# Patient Record
Sex: Female | Born: 1990 | Race: White | Hispanic: No | Marital: Married | Smoking: Never smoker
Health system: Southern US, Community
[De-identification: ages and names within clinical notes are randomized; demographics above are authoritative.]

## PROBLEM LIST (undated history)

## (undated) DIAGNOSIS — R102 Pelvic and perineal pain: Secondary | ICD-10-CM

## (undated) DIAGNOSIS — N809 Endometriosis, unspecified: Secondary | ICD-10-CM

## (undated) DIAGNOSIS — F419 Anxiety disorder, unspecified: Secondary | ICD-10-CM

## (undated) HISTORY — PX: APPENDECTOMY: SHX54

## (undated) HISTORY — DX: Anxiety disorder, unspecified: F41.9

## (undated) HISTORY — PX: DIAGNOSTIC LAPAROSCOPY: SUR761

---
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
## 2010-04-25 ENCOUNTER — Encounter: Payer: Self-pay | Admitting: Internal Medicine

## 2010-06-07 ENCOUNTER — Encounter: Admission: RE | Admit: 2010-06-07 | Discharge: 2010-06-07 | Payer: Self-pay | Admitting: Family Medicine

## 2010-06-11 ENCOUNTER — Encounter: Admission: RE | Admit: 2010-06-11 | Discharge: 2010-06-11 | Payer: Self-pay | Admitting: Family Medicine

## 2010-06-27 ENCOUNTER — Encounter: Admission: RE | Admit: 2010-06-27 | Discharge: 2010-06-27 | Payer: Self-pay | Admitting: Family Medicine

## 2010-08-22 DIAGNOSIS — L708 Other acne: Secondary | ICD-10-CM | POA: Insufficient documentation

## 2010-08-22 DIAGNOSIS — R21 Rash and other nonspecific skin eruption: Secondary | ICD-10-CM

## 2010-09-10 ENCOUNTER — Encounter (INDEPENDENT_AMBULATORY_CARE_PROVIDER_SITE_OTHER): Payer: Self-pay | Admitting: *Deleted

## 2010-09-10 DIAGNOSIS — K589 Irritable bowel syndrome without diarrhea: Secondary | ICD-10-CM | POA: Insufficient documentation

## 2010-09-10 DIAGNOSIS — R1084 Generalized abdominal pain: Secondary | ICD-10-CM

## 2010-09-10 DIAGNOSIS — G43909 Migraine, unspecified, not intractable, without status migrainosus: Secondary | ICD-10-CM | POA: Insufficient documentation

## 2010-09-10 DIAGNOSIS — M4120 Other idiopathic scoliosis, site unspecified: Secondary | ICD-10-CM | POA: Insufficient documentation

## 2010-09-10 DIAGNOSIS — N809 Endometriosis, unspecified: Secondary | ICD-10-CM | POA: Insufficient documentation

## 2010-09-10 DIAGNOSIS — I498 Other specified cardiac arrhythmias: Secondary | ICD-10-CM | POA: Insufficient documentation

## 2010-09-20 ENCOUNTER — Ambulatory Visit: Payer: Self-pay | Admitting: Internal Medicine

## 2010-09-20 DIAGNOSIS — Z8719 Personal history of other diseases of the digestive system: Secondary | ICD-10-CM

## 2010-09-20 DIAGNOSIS — N3010 Interstitial cystitis (chronic) without hematuria: Secondary | ICD-10-CM | POA: Insufficient documentation

## 2010-09-20 DIAGNOSIS — A039 Shigellosis, unspecified: Secondary | ICD-10-CM | POA: Insufficient documentation

## 2010-09-20 DIAGNOSIS — Z9089 Acquired absence of other organs: Secondary | ICD-10-CM

## 2011-01-24 NOTE — Miscellaneous (Signed)
Summary: Problems, Medications and Alleriges updated  Clinical Lists Changes  Problems: Added new problem of RASH AND OTHER NONSPECIFIC SKIN ERUPTION (ICD-782.1) Added new problem of ENDOMETRIOSIS, SITE UNSPECIFIED (ICD-617.9) Added new problem of IBS (ICD-564.1) Added new problem of DIARRHEA (ICD-787.91) Added new problem of MIGRAINE UNSP W/O INTRACT W/O STATUS MIGRAINOSUS (ICD-346.90) Added new problem of OTHER SPECIFIED CARDIAC DYSRHYTHMIAS (ICD-427.89) Added new problem of SCOLIOSIS , IDIOPATHIC (ICD-737.30) Added new problem of ABDOMINAL PAIN, GENERALIZED (ICD-789.07) Medications: Added new medication of DESONIDE 0.05 % CREA (DESONIDE) as directed by PCP Added new medication of IMITREX 25 MG TABS (SUMATRIPTAN SUCCINATE) as directed by PCP Added new medication of BIOTIN 10 MG TABS (BIOTIN) as directed by PCP Added new medication of OCELLA 3-0.03 MG TABS (DROSPIRENONE-ETHINYL ESTRADIOL) as directed by PCP Added new medication of MULTIVITAMINS  TABS (MULTIPLE VITAMIN) as directed by PCP Added new medication of ALIGN  CAPS (PROBIOTIC PRODUCT) as directed by PCP Allergies: Added new allergy or adverse reaction of VIBRAMYCIN (DOXYCYCLINE CALCIUM) Added new allergy or adverse reaction of AMOXICILLIN (AMOXICILLIN) Observations: Added new observation of NKA: F (09/10/2010 12:02)

## 2011-01-24 NOTE — Consult Note (Signed)
Summary: [facility]   Imported By: Florinda Marker 10/10/2010 09:34:27  _____________________________________________________________________  External Attachment:    Type:   Image     Comment:   External Document

## 2011-01-24 NOTE — Consult Note (Signed)
Summary: New Pt. Referral: [facility]  New Pt. Referral: [facility]   Imported By: Florinda Marker 10/08/2010 16:37:54  _____________________________________________________________________  External Attachment:    Type:   Image     Comment:   External Document

## 2011-01-24 NOTE — Assessment & Plan Note (Signed)
Summary: new pt facial rash/skin eruption   CC:  rash that has 8-9 times since returning from [place].  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 who was referred to me by Dr. Elias Else for evaluation of rash. Her mother, Waynetta Sandy, is with her today. Her current problem began in July of 2010. She traveled to [place] in early July and traveled along [place].  She felt well while there  but shortly after her return she developed a new rash on her right anterior thigh.  It initially started as the lining of small red bumps about 4 inches long.  After a few days the lesions started to blister and loose fluid within the area became swollen and painful before finally scabbing over and slowly healing.  It took about two weeks for the rash to completely resolve.  It did not itch.  She did not have any fever. Since that initial episode, she estimates that she's had 7 other recurrences of the rash, all in different locations.  Each time it would follow the same general pattern.  The last episode seemed to be a little worse and with that one she had some rash on her face and on her left wrist.  She does not have any active lesions at this time.  She has tried topical steroids without any relief and she has gotten no apparent benefit from oral prednisone or antibiotics including Keflex.  She saw an Geophysicist/field seismologist and Dr. Thayer Ohm office and had a skin biopsy in June.  All she recalls is that she was told the biopsy did not show any herpes or cancer.  She also is concerned about some new sharp abdominal pain that she's had for the last two weeks.  She says that it's a different from the pain she has with her chronic intermittent diarrhea or her interstitial cystitis.  She's had a few episodes of nausea and vomiting with the pain but again no fever.  Preventive Screening-Counseling & Management  Alcohol-Tobacco     Alcohol drinks/day: 0     Smoking Status: never  Caffeine-Diet-Exercise   Caffeine use/day: yes   Prior Medication List:  DESONIDE 0.05 % CREA (DESONIDE) as directed by PCP IMITREX 25 MG TABS (SUMATRIPTAN SUCCINATE) as directed by PCP BIOTIN 10 MG TABS (BIOTIN) as directed by PCP OCELLA 3-0.03 MG TABS (DROSPIRENONE-ETHINYL ESTRADIOL) as directed by PCP MULTIVITAMINS  TABS (MULTIPLE VITAMIN) as directed by PCP ALIGN  CAPS (PROBIOTIC PRODUCT) as directed by PCP   Current Allergies: ! VIBRAMYCIN (DOXYCYCLINE CALCIUM) ! AMOXICILLIN (AMOXICILLIN) Vital Signs:  Patient profile:   20 year old female Height:      65 inches (165.10 cm) Weight:      131.5 pounds (59.77 kg) BMI:     21.96 Temp:     98.6 degrees F oral Pulse rate:   94 / minute BP sitting:   102 / 69  Vitals Entered By: Jennet Maduro RN (September 20, 2010 2:27 PM) CC: rash that has 8-9 times since returning from [place] Is Patient Diabetic? No Pain Assessment Patient in pain? yes     Location: UPPER ABDOMINA Intensity: 4 Type: sharp Onset of pain  Constant Nutritional Status BMI of 19 -24 = normal Nutritional Status Detail appetite "recovering from c.diff, eating better"  Have you ever been in a relationship where you felt threatened, hurt or afraid?not asked mother present   Does patient need assistance? Functional Status Self care Ambulation Normal   Physical  Exam  General:  alert and well-nourished.   Mouth:  good dentition, pharynx pink and moist, no erythema, and no exudates.   Lungs:  normal breath sounds, no crackles, and no wheezes.   Heart:  normal rate, regular rhythm, and no murmur.   Abdomen:  soft, non-tender, normal bowel sounds, no distention, no masses, no hepatomegaly, and no splenomegaly.   Skin:  no rashes and no suspicious lesions.   Cervical Nodes:  no anterior cervical adenopathy and no posterior cervical adenopathy.   Axillary Nodes:  no R axillary adenopathy and no L axillary adenopathy.   Psych:  normally interactive, good eye contact, not anxious  appearing, and not depressed appearing.     Impression & Recommendations:  Problem # 1:  RASH AND OTHER NONSPECIFIC SKIN ERUPTION (ICD-782.1) I am not sure what is causing her migratory, recurrent skin lesions.  There is a temporal relationship with her travel to [place] but I am not certain that these are due to some travel-related infection.  I have shown her pictures of cutaneous larva migrans and larva currens but she and her mother do not think a rash looks like either of these.  I will obtain the formal report from her skin biopsy and have asked her to call me if she has recurrent lesions so I can see them or at least have her send pictures.  Hopefully that will help pinpoint a treatable diagnosis. Her updated medication list for this problem includes:    Desonide 0.05 % Crea (Desonide) .Marland Kitchen... As directed by pcp  Orders: Consultation Level IV (78469)  Problem # 2:  ABDOMINAL PAIN, GENERALIZED (ICD-789.07) I'm not sure what's causing her recent abdominal pain.  She does not have anything to suggest an acute abdomen.  She thinks the pain is different than the pains she has with her IBS and interstitial cystitis.  I have suggested that she simply to manage it symptomatically and see if it resolves with time. Orders: Consultation Level IV ([id number])  Patient Instructions: 1)  Please schedule a follow-up appointment as needed.

## 2011-01-24 NOTE — Miscellaneous (Signed)
Summary: HIPAA Restrictions  HIPAA Restrictions   Imported By: Florinda Marker 09/20/2010 15:55:38  _____________________________________________________________________  External Attachment:    Type:   Image     Comment:   External Document

## 2011-03-10 LAB — POCT PREGNANCY, URINE: Preg Test, Ur: NEGATIVE

## 2011-05-07 NOTE — Op Note (Signed)
NAME:  Linda Stevenson, Linda Stevenson         ACCOUNT NO.:  [id number]   MEDICAL RECORD NO.:  [id number]          PATIENT TYPE:  AMB   LOCATION:  ENDO                         FACILITY:  [facility]   PHYSICIAN:  James L. Malon Kindle., M.D.DATE OF BIRTH:  May 09, 1991   DATE OF PROCEDURE:  05/03/2009  DATE OF DISCHARGE:                               OPERATIVE REPORT   PROCEDURE:  Esophagogastroduodenoscopy with biopsy.   MEDICATIONS:  1. Hurricaine spray.  2. Fentanyl 100 mcg.  3. Versed 10 mg IV.   INDICATION:  The patient has had abdominal pain, weight loss, and  diarrhea of unclear etiology.  Workup has been negative for stool  cultures and CT enterography.  She has failed therapy with  antispasmodics and Imodium.  She has been evaluated for celiac disease  and Crohn's colitis.   DESCRIPTION OF PROCEDURE:  The procedure been explained to the patient's  mother and consent obtained.  In the left lateral decubitus position,  the Pentax adult scope was inserted and advanced.  The stomach was  entered, the pylorus identified and passed.  We were able to advance way  down into the second duodenum, which appeared grossly normal.  Multiple  random biopsies were obtained to evaluate for celiac disease.  The  duodenal bulb was normal as was the pyloric channel.  The antrum and  body of the stomach were endoscopically normal on the forward and in the  retroflexed view.  The distal esophagus was somewhat reddened.  The  scope was withdrawn, the patient tolerated the procedure well, there  were no immediate complications.   ASSESSMENT:  Diarrhea, weight loss, and abdominal pain without obvious  cause on upper endoscopy.  Will evaluate for celiac disease.           ______________________________  Llana Aliment. Malon Kindle., M.D.     Waldron Session  D:  05/03/2009  T:  05/03/2009  Job:  [id number]   cc:   Dario Guardian, M.D.  Fax: [phone]   Charles A. Sydnee Cabal, MD  Fax: [phone]

## 2011-05-07 NOTE — Op Note (Signed)
NAME:  Linda Stevenson, Linda Stevenson         ACCOUNT NO.:  [id number]   MEDICAL RECORD NO.:  [id number]          PATIENT TYPE:  AMB   LOCATION:  SDC                           FACILITY:  [facility]   PHYSICIAN:  Charles A. Delcambre, MDDATE OF BIRTH:  1991-01-10   DATE OF PROCEDURE:  12/19/2008  DATE OF DISCHARGE:                               OPERATIVE REPORT   PREOPERATIVE DIAGNOSES:  1. Pelvic pain.  2. Dysmenorrhea.   POSTOPERATIVE DIAGNOSES:  1. Pelvic pain.  2. Dysmenorrhea.  3. 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  4. Endometriosis, mild, active disease.   PROCEDURE:  1. Diagnostic laparoscopy.  2. Laser endometriosis.  3. Major lysis of adhesions greater than 1 hour modifier 22.  4. Laser uterosacral nerve ablation.   SURGEON:  Charles A. Delcambre, MD   ASSISTANT:  None.   COMPLICATIONS:  None.   ANESTHESIA:  General by the endotracheal route.   FINDINGS:  Masters window on the right ovarian fossa.  No active disease  noted in that area.  Some endometriosis on the right uterosacral noted.  Moderate adhesions of the omentum to the bladder.  Bladder adhesions to  the left pelvic sidewall on tension with previous appendectomy scar  noted.  The cecum was tented upward and adherent and on tension to this  area with omentum adherent in this area as well.  Omentum was adherent  in the hypogastric region directly up to the anterior abdominal wall.  Bowel in the upper portion of the juncture of the ascending and  transverse colon was tented upward and somewhat pulled down toward the  appendix site and this again was on tension.  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  Moderate adhesions were noted consistent with previous ruptured appendix  of the fallopian tubes, proximal region to the uterus and down in the  cul-de-sac.  However, distal ends of fimbria looked normal bilaterally.  Ovaries were normal  bilaterally after lysis of adhesions to mobilize the  ovaries was done.  Ureters were clearly seen during the entire case on  the left and right side.  Dissection for extensive lysis of adhesions  took over an hour in the case.   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   ESTIMATED BLOOD LOSS:  Less than 10 mL.   Instrument, sponge, and needle count correct x2.   DESCRIPTION OF PROCEDURE:  The patient was taken to the operating room,  placed in supine position.  General anesthetic was induced without  difficulty.  Sterile prep and drape was undertaken.  Cohen cannula was  placed with tenaculum on the cervix.  A 0.25% plain Marcaine was  injected subumbilically and 2 fingerbreadths below the symphysis pubis.  Later in the case a second lower port approximately 4 cm lateral to the  first was placed.  A 1-cm incision was made at the umbilicus to place  the operating scope.  The Veress needle was placed with anterior  traction on the abdominal wall.  Aspiration injection and reaspiration,  hanging drop test and insufflation pressures less 8 mmHg at 2 liters  per  minute all indicated intraperitoneal location.  Adequate  pneumoperitoneum was achieved to 2.5 liters.  The Veress needle was  removed.  Anterior traction was again placed and 10-mm port was placed  at the umbilicus.  Verification into intraperitoneal location and  verification of no damage to surrounding structures was made by  placement of the scope.  Findings were noted above in some areas of the  omental adhesions and the bowel adhesions staying well away from the  bowel and up against the sidewalls.  The EnSeal was used to lyse  adhesions.  Some points around the uterus adhesions were lysed with the  unipolar cautery.  Bilateral cautery was used once for hemostasis.  It  was felt that the adhesions of the bowel up to the sidewall did lyse  well safely and did relax all intestinal structures back into normal  anatomical  position.  At the termination of procedure, the uterus and  bladder, the fallopian tubes and ovaries were all mobilized and ureters  were clearly seen.  Laser uterosacral nerve ablation was done by  ablating the proximal uterosacral ligaments approximately two-thirds of  the way through for about 2 cm across the midline and then down the  other side in like fashion approximately 1 cm.  Hemostasis was  excellent.  Low-pressure hemostasis was verified upon removal of all  trocar sites.  Desufflation was allowed to occur.  Peritoneal edge was  seen upon withdrawal of the umbilical port and 0 Vicryl was then placed  to close the fascia at the umbilicus.  Hemostasis was excellent at all  sites.  The skin was closed with Dermabond at all three sites.  The  vaginal instruments were removed.  Hemostasis was excellent.  The  patient was awakened, taken to recovery with physician in attendance,  having tolerated the procedure well.      Charles A. Sydnee Cabal, MD  Electronically Signed     CAD/MEDQ  D:  12/19/2008  T:  12/19/2008  Job:  [id number]

## 2011-05-07 NOTE — Op Note (Signed)
NAME:  Linda Stevenson, CAMERA         ACCOUNT NO.:  [id number]   MEDICAL RECORD NO.:  [id number]          PATIENT TYPE:  AMB   LOCATION:  ENDO                         FACILITY:  [facility]   PHYSICIAN:  James L. Malon Kindle., M.D.DATE OF BIRTH:  [birth year]   DATE OF PROCEDURE:  05/03/2009  DATE OF DISCHARGE:                               OPERATIVE REPORT   PROCEDURE:  Colonoscopy with biopsy.   MEDICATIONS:  Fentanyl 125 mcg, Versed 12 mg, Phenergan 12.5 mg IV for  both procedures.   INDICATIONS:  Diarrhea with weight loss and abdominal pain of unclear  etiology.   DESCRIPTION OF PROCEDURE:  The procedure has been explained to the  patient and consent obtained.  In the left lateral decubitus position  following the endoscopy, the Pentax pediatric colonoscope was inserted  and advanced.  The prep was excellent.  Using abdominal pressure, we  were able to reach the cecum.  The ileocecal valve and appendiceal  orifice seen.  The terminal ileum entered for approximately 10 cm.  It  was grossly endoscopically normal.  Several biopsies were obtained.  The  scope was withdrawn, and the colonic mucosa throughout was completely  normal.  Multiple random biopsies were obtained and sent in a third jar  to look for colitis.  The rectum was normal in both the forward and  retroflexed view.  The patient tolerated the procedure well.   ASSESSMENT:  Diarrhea, weight loss, and abdominal pain, unclear  etiology.  Rule out microscopic colitis or inflammatory bowel disease.   PLAN:  Will see the patient back in the office in 2 weeks.  Continue on  her current medications for now.           ______________________________  Llana Aliment. Malon Kindle., M.D.     Waldron Session  D:  05/03/2009  T:  05/03/2009  Job:  [id number]   cc:   Dario Guardian, M.D.  Fax: [phone]   Charles A. Sydnee Cabal, MD  Fax: [phone]

## 2011-05-10 NOTE — Op Note (Signed)
NAME:  Linda Stevenson, Linda Stevenson                   ACCOUNT NO.:  [id number]   MEDICAL RECORD NO.:  [id number]                   PATIENT TYPE:  OIB   LOCATION:  2550                                 FACILITY:  [facility]   PHYSICIAN:  Leonia Corona, M.D.               DATE OF BIRTH:  [birth year]   DATE OF PROCEDURE:  07/26/2003  DATE OF DISCHARGE:                                 OPERATIVE REPORT   PREOPERATIVE DIAGNOSES:  1. Healed perforated appendicitis.  2. Multiple benign skin lesions on the right side of the neck.   POSTOPERATIVE DIAGNOSES:  1. Healed perforated appendicitis.  2. Multiple benign skin  lesions on the right side of the neck.   PROCEDURES PERFORMED:  1. Open appendectomy.  2. Wide excision of benign lesions on the neck x3.   ANESTHESIA:  General endotracheal tube anesthesia.   SURGEON:  Leonia Corona, M.D.   ASSISTANTDonnella Bi D. Pendse, M.D.   INDICATIONS FOR PROCEDURE:  This 20 year old female child was admitted with  acute perforated appendicitis with abscess formation about 3 months ago  which was treated nonoperatively by percutaneous drainage and antibiotic  therapy. She responded well and was discharged in good condition with 3  weeks course of antibiotics. This time she was admitted for elective  appendectomy.   She also has 3 lesions, each measuring about 3 to 4 mm in size on the right  side of the neck. These have remained unchanged over a period of time as  indications for the procedure.   DESCRIPTION OF PROCEDURE:  The patient was brought to the operating room and  placed in the supine position on the operating table. General endotracheal  tube anesthesia is given. The right lower quadrant of the abdomen and the  area of the abdominal wall is preprepped and draped in the usual sterile  manner.   A right lower quadrant McBurney incision centered at the McBurney point is  made, measuring about 4 cm, extending equally on either side of the  McBurney  point along the skin crease. The incision is deepened through the  subcutaneous tissue with electrocautery until the external aponeurosis is  reached. The external oblique is incised in the line of  its fibers. The  internal oblique and transverse abdominus muscles are split in the line of  their fibers. The peritoneum is visualized and it is held up between 2  clamps and divided between 2 clamps.   The cecum was found to be just beneath the incision which was held up with  fingers and traced leading to the small, fibrotic, curled up appendix which  was curled within position. It was hooked up with a finger and delivered  partly out of the wound. The cecum was partly mobilized and also delivered  partly out of the incision and the appendix was held up with a Babcock  forceps.   The mesoappendix was divided between clamps and ligated with 3-0 silk  until  the base of the appendix was clear. The base of the appendix was crushed  with a straight clamp and ligated with 2-0 Vicryl. The appendix was divided  above and removed from the field. The stump was buried under a pursestring  taken with 3-0 silk on the wall of the cecum.   The cecum with the ligated buried appendix was returned back into the  peritoneal cavity and local irrigation with saline was done. No bleeding was  noted. The abdomen is now closed.   The peritoneum was now closed using 2-0 Vicryl running stitches. The  internal oblique and transverse abdominus muscles were approximated with 2-0  Vicryl interrupted sutures. The external oblique was approximated using 2-0  Vicryl interrupted sutures. Approximately 10 mL of 0.25% Marcaine with  epinephrine was infiltrated in and around the incision for postoperative  pain control. The skin was closed with 5-0 Monocryl subcuticular stitch.  Steri-Strips were applied which were covered with sterile gauze and Tegaderm  dressing.   After completing this part of  the procedure  attention was turned toward the  neck. The neck was turned to the left side. The right side of the neck was  cleaned, prepped and draped in the usual manner.   An elliptical incision was made, removing the 1st anterior lesion and  the  lesion  was  excised with full thickness skin, leaving a clear margin on all sides. The  edges of  the skin  were mobilized and the defect was closed primarily using  6-0 Prolene interrupted sutures.   The 2nd and 3rd lesions were too close to each other  and they were both  enclosed in 1 elliptical incision around it leaving a clear margin of the  lesion. The lesion was lifted off in the subcutaneous plane with the help of  scissors and excised completely. The edges of the skin  were mobilized.  Oozing and bleeding spots were cauterized. The defect was closed primarily  using 6-0 Prolene interrupted sutures.   Approximately 3 mL of 0.25% Marcaine with epinephrine was infiltrated in and  around the incision. Steri-Strips were applied over the incision. A gauze  dressing  was applied.   The patient tolerated the procedure very well which went smooth and  uneventful. The patient was later extubated and transported to the recovery  room in good and stable condition.                                                Leonia Corona, M.D.    SF/MEDQ  D:  07/26/2003  T:  07/26/2003  Job:  [id number]   cc:   Dario Guardian, M.D.  [address]  [place]  [place] [address]  Fax: [phone]

## 2011-05-10 NOTE — Discharge Summary (Signed)
NAME:  Linda Stevenson, Linda Stevenson                   ACCOUNT NO.:  [id number]   MEDICAL RECORD NO.:  [id number]                   PATIENT TYPE:  INP   LOCATION:  6121                                 FACILITY:  [facility]   PHYSICIAN:  Leonia Corona, M.D.               DATE OF BIRTH:  1991/07/25   DATE OF ADMISSION:  04/14/2003  DATE OF DISCHARGE:  04/20/2003                                 DISCHARGE SUMMARY   DISCHARGE DIAGNOSES:  1. Ruptured acute appendicitis with walled-off abscess.  2. Status post CT-guided drainage of the abscess on April 15, 2003.  3. Peripherally inserted central catheter line placement on April 15, 2003.   DISCHARGE MEDICATIONS:  1. Percocet 5/325 mg one tablet p.o. q.4h. p.r.n. pain.  2. Rocephin 1 g IV q.24h. x10 days, then p.o. antibiotics per Dr. Sanjuan Dame     [place]/Dr. Donnella Bi D. Pendse.   FURTHER DISCHARGE INSTRUCTIONS:   DIET:  The patient is to advance her diet as tolerated.   WOUND CARE:  Wound care is to be dressing changes per Dr. Levie Heritage.  PICC line  is to be performed by the home health agency.   FOLLOWUP:  Followup is with Dr. Levie Heritage by phone on April 21, 2003.  The  patient was also instructed to call for a temperature greater than 101,  vomiting, increased pain or any other questions or concerns.   ADMISSION HISTORY:  The patient is an 20 year old female who presented with  an eight-day history of fever, vomiting, abdominal pain and diarrhea.  Her  pain was diffuse and moved to the right lower quadrant.  The patient had  complained of anorexia and pain worsening with movement.  She saw Dr. Oletta Darter. Reid the morning of admission, who ordered a CT of the abdomen which  showed an abscess and ruptured appendicitis.   PAST MEDICAL HISTORY:  She was diagnosed with asthma but has no emergency  room visits, no hospitalizations.  No previous hospitalizations or  surgeries.   MEDICINES:  Albuterol MDI one to two times per year.   ALLERGIES:  No  known drug allergies.   HEALTH MAINTENANCE:  Immunizations are up to date.   SOCIAL HISTORY:  She lives with her mother, father and a 74 year old  sisters.  She has four dogs and city water.  No tobacco, drugs, alcohol,  ________.  No exposure to TB.   FAMILY HISTORY:  Maternal grandfather has lung cancer, maternal grandmother  with breast cancer, paternal grandfather with skin cancer and there is  hypertension in multiple family members.   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   REVIEW OF SYSTEMS:  Review of systems significant for rash around the nose  and scoliosis, otherwise, negative without any  eye/ear/heart/GI/lung/hematologic problems.   PHYSICAL EXAMINATION:  VITAL SIGNS:  Temperature 99.1, pulse 131, blood  pressure 118/92, respirations 22  and SAO2 was 99% on room air.  Weight is  46.8 kg (76th percentile).  GENERAL:  She is a pleasant, acutely ill white female in no acute distress  except for wincing with exam and walking.  HEENT:  Within normal limits.  BREASTS:  Tanner 2 to 3.  CARDIOVASCULAR:  Regular rate and rhythm, slightly tachycardic without  murmurs.  LUNGS:  Lungs are clear to auscultation bilaterally.  ABDOMEN:  Abdomen with hypoactive bowel sounds, tender in the right lower  quadrant with some guarding.  No hepatosplenomegaly.  Positive psoas sign.  GENITALIA:  Tanner 3 with normal external female genitalia.  EXTREMITIES:  No clubbing, cyanosis or edema and has good capillary refill.   LABORATORY DATA:  At presentation, labs were done by the primary and were  not available but eventually revealed a white count of 15.0 with an ANC of  12.8.  Electrolytes were all within normal.   HOSPITAL COURSE:  This 20 year old was admitted for evaluation and treatment  of a ruptured appendicitis with subsequent walled-off appendiceal abscess.  She underwent a CT-guided drainage of the  abscess as well as PICC line  placement on April 15, 2003.  She was treated with IV antibiotics --  ampicillin, gentamicin and clindamycin -- for five days and then  transitioned over to Rocephin daily.  She received IV fluids and IV pain  medication and was then titrated to oral intake as well as oral pain  medicines.  The hospital course was otherwise uncomplicated.   Blood cultures was obtained on approximately hospital day #4 for spiking  fevers, but at the time of discharge, had no growth to date and note, the  patient was on antibiotics at the time of it being drawn.  Wound culture  from the abscess drainage revealed a moderate E. coli that was pansensitive  to all medicines tested, including Rocephin.  It was only insensitive to  ampicillin.   She is to be followed as an outpatient and to continue with a 10-day course  of IV antibiotics and then p.o. antibiotics and then to return in  approximately three weeks for an appendectomy to be performed by Dr.  Leeanne Mannan or Dr. Levie Heritage.   At the time of discharge, the patient was feeling much better.  She had  minimal pain but did have good control of this with p.o. Percocet.  She was  not vomiting and tolerating a full-liquid diet.  She had been afebrile for  greater than 48 hours.  She is to continue with the medicines and to follow  up as previously described.     Douglass Rivers, M.D.                      Leonia Corona, M.D.    CH/MEDQ  D:  04/20/2003  T:  04/20/2003  Job:  [id number]   cc:   Dario Guardian, M.D.  [address]  [place]  [place] [address]  Fax: [phone]

## 2011-07-31 IMAGING — US US SOFT TISSUE HEAD/NECK
1 series · 1 of 1 positions shown · non-contrast
Comparison: None.

CLINICAL DATA: 18-year-0-month-old female who reports a the right
neck palpable abnormality near collarbone which she first noticed
after starting Depo-Provera.  She reports the area is slowly
increasing in size and is sometimes painful (with neck motion).
Past medical history otherwise positive only for appendicitis,
endometriosis, and colitis.

ULTRASOUND OF HEAD/NECK SOFT TISSUES
TECHNIQUE: Ultrasound examination of the head and neck soft
tissues was performed in the area of clinical concern.

[Series 1: us soft tissue head/neck · 1 of 1 slices shown]
[im 1/1]
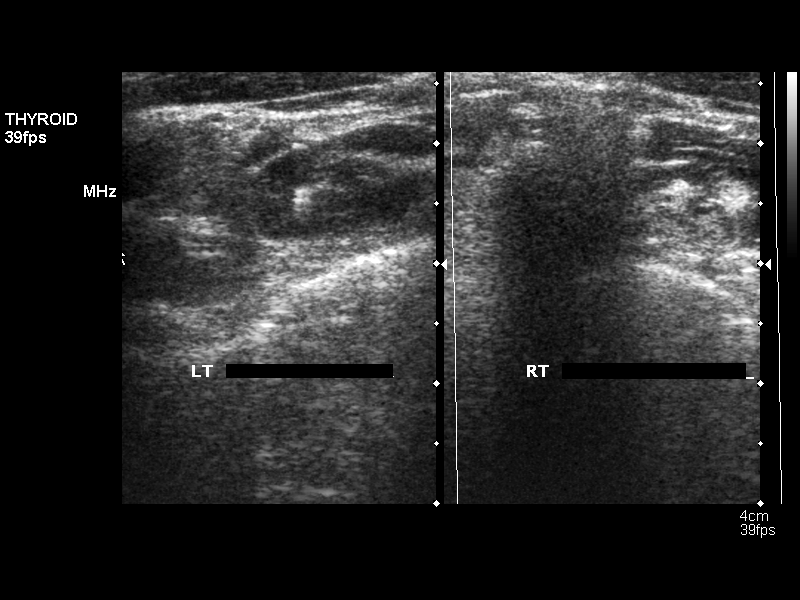

[1 of 1 positions shown; findings below may reference images not displayed]

FINDINGS: Ultrasound scanning with a linear transducer was
performed over the area of clinical concern and on the
contralateral normal left side for comparison.  Initially, only
normal fibromuscular architecture and slightly prominent central
venous structures are identified in the area of clinical concern.

I examined the patient, and noted the area of asymmetry which to an
extent is parallel to the expected course of the right
sternocleidomastoid muscle.  However, the area is very firm and did
not change with changes in the patient's head position.

Repeat ultrasound scanning over this area then depicted a 2.20 cm
area of indistinct hypoechogenicity with shadowing.  This is
asymmetric when compared to the left side.  The right carotid
sheath structures are in the vicinity, but do not appear to
directly correspond to the lesion.  Repeat palpation, some
pulsatility related to the carotid vasculature is palpable. No
surrounding lymphadenopathy is identified.
IMPRESSION: Indeterminate 2.2-cm area corresponding to the palpable abnormality
at the right neck just above the clavicle has indistinct margins
and sonographic shadowing.  This might represent an area of dense
fibrous tissue such as a desmoid. However, a neck CT with contrast
is recommended for more definitive characterization.  I discussed
the findings and recommendation with the patient.

## 2011-09-27 LAB — CBC
HCT: 43.6 % (ref 36.0–49.0)
Hemoglobin: 14.8 g/dL (ref 12.0–16.0)
MCHC: 34 g/dL (ref 31.0–37.0)
MCV: 92.4 fL (ref 78.0–98.0)
Platelets: 233 10*3/uL (ref 150–400)
RBC: 4.72 MIL/uL (ref 3.80–5.70)
WBC: 5.9 10*3/uL (ref 4.5–13.5)

## 2011-09-27 LAB — BASIC METABOLIC PANEL
BUN: 9 mg/dL (ref 6–23)
CO2: 23 mEq/L (ref 19–32)
Calcium: 8.7 mg/dL (ref 8.4–10.5)
Creatinine, Ser: 0.88 mg/dL (ref 0.4–1.2)
Potassium: 4 mEq/L (ref 3.5–5.1)

## 2012-05-08 DIAGNOSIS — L710 Perioral dermatitis: Secondary | ICD-10-CM | POA: Insufficient documentation

## 2015-07-24 HISTORY — PX: WISDOM TOOTH EXTRACTION: SHX21

## 2016-03-20 ENCOUNTER — Inpatient Hospital Stay (HOSPITAL_COMMUNITY)
Admission: RE | Admit: 2016-03-20 | Discharge: 2016-03-20 | Disposition: A | Discharge status: Home / Self Care | Payer: Self-pay | Source: Ambulatory Visit

## 2016-03-22 NOTE — Patient Instructions (Addendum)
Your procedure is scheduled on:  Tuesday, April 02, 2016  Enter through the Main Entrance of [facility] at: 6:00 AM  Pick up the phone at the desk and dial [phone].  Call this number if you have problems the morning of surgery: [phone].  Remember: Do NOT eat food or drink after:  Midnight Monday  Take these medicines the morning of surgery with a SIP OF WATER: None  Do NOT wear jewelry (body piercing), metal hair clips/bobby pins, make-up, or nail polish. Do NOT wear lotions, powders, or perfumes.  You may wear deodorant. Do NOT shave for 48 hours prior to surgery. Do NOT bring valuables to the hospital. Contacts, dentures, or bridgework may not be worn into surgery.  Have a responsible adult drive you home and stay with you for 24 hours after your procedure.  Home with husband Genelle BalBrett cell [phone].

## 2016-03-25 ENCOUNTER — Encounter (HOSPITAL_COMMUNITY)
Admission: RE | Admit: 2016-03-25 | Discharge: 2016-03-25 | Disposition: A | Discharge status: Home / Self Care | Payer: BC Managed Care – PPO | Source: Ambulatory Visit | Attending: Obstetrics and Gynecology | Admitting: Obstetrics and Gynecology

## 2016-03-25 ENCOUNTER — Encounter (HOSPITAL_COMMUNITY): Payer: Self-pay

## 2016-03-25 DIAGNOSIS — Z01812 Encounter for preprocedural laboratory examination: Secondary | ICD-10-CM | POA: Diagnosis present

## 2016-03-25 HISTORY — DX: Pelvic and perineal pain: R10.2

## 2016-03-25 HISTORY — DX: Endometriosis, unspecified: N80.9

## 2016-03-25 LAB — CBC
HCT: 41.8 % (ref 36.0–46.0)
Hemoglobin: 14.4 g/dL (ref 12.0–15.0)
MCH: 31.7 pg (ref 26.0–34.0)
MCHC: 34.4 g/dL (ref 30.0–36.0)
MCV: 92.1 fL (ref 78.0–100.0)
PLATELETS: 250 10*3/uL (ref 150–400)
RBC: 4.54 MIL/uL (ref 3.87–5.11)
RDW: 13.1 % (ref 11.5–15.5)
WBC: 7.9 10*3/uL (ref 4.0–10.5)

## 2016-03-28 NOTE — H&P (Addendum)
  Patient name Linda Stevenson, Amya DICTATION# [id number] CSN# [id number]  [facility]Leonel Mccollum S, MD 03/28/2016 4:55 PM

## 2016-03-29 NOTE — H&P (Signed)
NAME:  Linda Stevenson, Linda Stevenson               ACCOUNT NO.:  [id number]  MEDICAL RECORD NO.:  [id number]  LOCATION:                                [facility]ILITY:  [facility]  PHYSICIAN:  Juluis MireJohn S. Tarius Stangelo, M.D.   DATE OF BIRTH:  06/03/1991  DATE OF ADMISSION:  04/02/2016 DATE OF DISCHARGE:                             HISTORY & PHYSICAL   HISTORY OF PRESENT ILLNESS:  The patient is a 25 year old, nulligravida, married female, who presents for diagnostic laparoscopy.  Her past history is significant and that she had a previous appendicitis. Because of pain in 2009, she underwent diagnostic laparoscopy with finding of endometriosis and pelvic adhesions.  She had been having increasing pain with intercourse that is becoming limited, and we are going to proceed with laparoscopic evaluation.  Her other significant history is that she does have a history of interstitial cystitis.  This is followed by Dr. Jacquelyne BalintMcDermott.  ALLERGIES:  She is allergic to AMOXIL and DOXYCYCLINE.  MEDICATIONS:  She is on prenatal vitamins.  PAST MEDICAL HISTORY:  She has a history of interstitial cystitis and migraine headaches.  In 2004, she had an appendectomy.  In 2009, diagnostic laparoscopy, and a wisdom tooth extraction in 2016.  SOCIAL HISTORY:  She has no tobacco and only minimal alcohol use.  FAMILY HISTORY:  There is a family history of breast, lung, and skin cancer in grandparents.  REVIEW OF SYSTEMS:  Noncontributory.  PHYSICAL EXAMINATION:  VITAL SIGNS:  The patient is afebrile.  Stable vital signs. HEENT:  The patient is normocephalic.  Pupils equal, round, and reactive to light and accommodation.  Extraocular movements were intact.  Sclerae and conjunctivae are clear.  Oropharynx clear. NECK:  Without thyromegaly. BREASTS:  No discrete masses. LUNGS:  Clear. CARDIOVASCULAR:  Regular rate.  No murmurs or gallops. ABDOMEN:  Benign.  No mass, organomegaly, or tenderness. PELVIC:  Normal external genitalia.  Vaginal  mucosa is clear.  Cervix unremarkable.  Uterus normal size, shape, and contour.  Adnexa, free of masses.  Does have marked pelvic tenderness.  IMPRESSION: 1. Pelvic pain and discomfort and dyspareunia, rule out recurrent     adhesions and/or endometriosis. 2. Interstitial cystitis.  PLAN:  The patient will undergo open laparoscopy.  The risks of surgery have been discussed including the risk of infection.  The risk of hemorrhage that could require transfusion with the risk of AIDS or hepatitis.  Risk of injury to adjacent organs such as bladder, bowel, ureters that could require further exploratory surgery.  Risk of deep venous thrombosis and pulmonary embolus.  The patient expressed understanding of the indications and risks.     Juluis MireJohn S. Trever Streater, M.D.     JSM/MEDQ  D:  03/28/2016  T:  03/28/2016  Job:  [id number]

## 2016-04-02 ENCOUNTER — Ambulatory Visit (HOSPITAL_COMMUNITY)
Admission: RE | Admit: 2016-04-02 | Discharge: 2016-04-02 | Disposition: A | Discharge status: Home / Self Care | Payer: BC Managed Care – PPO | Source: Ambulatory Visit | Attending: Obstetrics and Gynecology | Admitting: Obstetrics and Gynecology

## 2016-04-02 ENCOUNTER — Ambulatory Visit (HOSPITAL_COMMUNITY): Payer: BC Managed Care – PPO | Admitting: Anesthesiology

## 2016-04-02 ENCOUNTER — Encounter (HOSPITAL_COMMUNITY): Payer: Self-pay | Admitting: Anesthesiology

## 2016-04-02 ENCOUNTER — Encounter (HOSPITAL_COMMUNITY)
Admission: RE | Disposition: A | Discharge status: Home / Self Care | Payer: Self-pay | Source: Ambulatory Visit | Attending: Obstetrics and Gynecology

## 2016-04-02 DIAGNOSIS — Z880 Allergy status to penicillin: Secondary | ICD-10-CM | POA: Diagnosis not present

## 2016-04-02 DIAGNOSIS — R102 Pelvic and perineal pain unspecified side: Secondary | ICD-10-CM

## 2016-04-02 DIAGNOSIS — N3010 Interstitial cystitis (chronic) without hematuria: Secondary | ICD-10-CM | POA: Diagnosis not present

## 2016-04-02 DIAGNOSIS — N803 Endometriosis of pelvic peritoneum: Secondary | ICD-10-CM | POA: Insufficient documentation

## 2016-04-02 DIAGNOSIS — N809 Endometriosis, unspecified: Secondary | ICD-10-CM

## 2016-04-02 DIAGNOSIS — N736 Female pelvic peritoneal adhesions (postinfective): Secondary | ICD-10-CM

## 2016-04-02 DIAGNOSIS — N941 Unspecified dyspareunia: Secondary | ICD-10-CM | POA: Insufficient documentation

## 2016-04-02 HISTORY — PX: LAPAROSCOPY: SHX197

## 2016-04-02 LAB — HCG, SERUM, QUALITATIVE: Preg, Serum: NEGATIVE

## 2016-04-02 SURGERY — LAPAROSCOPY, DIAGNOSTIC
Anesthesia: General

## 2016-04-02 MED ORDER — OXYCODONE-ACETAMINOPHEN 7.5-325 MG PO TABS: 1.0000 | ORAL_TABLET | ORAL | Status: DC | PRN

## 2016-04-02 MED ORDER — DEXAMETHASONE SODIUM PHOSPHATE 10 MG/ML IJ SOLN
INTRAMUSCULAR | Status: AC
  Filled 2016-04-02: qty 1

## 2016-04-02 MED ORDER — FENTANYL CITRATE (PF) 100 MCG/2ML IJ SOLN
25.0000 ug | INTRAMUSCULAR | Status: DC | PRN
  Administered 2016-04-02: 50 ug via INTRAVENOUS

## 2016-04-02 MED ORDER — SUCCINYLCHOLINE CHLORIDE 20 MG/ML IJ SOLN
INTRAMUSCULAR | Status: AC
  Filled 2016-04-02: qty 1

## 2016-04-02 MED ORDER — ONDANSETRON HCL 4 MG/2ML IJ SOLN
INTRAMUSCULAR | Status: DC | PRN
  Administered 2016-04-02: 4 mg via INTRAVENOUS

## 2016-04-02 MED ORDER — METOCLOPRAMIDE HCL 5 MG/ML IJ SOLN
INTRAMUSCULAR | Status: AC
  Administered 2016-04-02: 10 mg via INTRAVENOUS
  Filled 2016-04-02: qty 2

## 2016-04-02 MED ORDER — LACTATED RINGERS IV SOLN
INTRAVENOUS | Status: DC
  Administered 2016-04-02 (×3): via INTRAVENOUS

## 2016-04-02 MED ORDER — GLYCOPYRROLATE 0.2 MG/ML IJ SOLN
INTRAMUSCULAR | Status: DC | PRN
  Administered 2016-04-02: 0.1 mg via INTRAVENOUS
  Administered 2016-04-02: 0.4 mg via INTRAVENOUS

## 2016-04-02 MED ORDER — MIDAZOLAM HCL 2 MG/2ML IJ SOLN
INTRAMUSCULAR | Status: DC | PRN
  Administered 2016-04-02: 2 mg via INTRAVENOUS

## 2016-04-02 MED ORDER — LIDOCAINE HCL (CARDIAC) 20 MG/ML IV SOLN
INTRAVENOUS | Status: AC
  Filled 2016-04-02: qty 5

## 2016-04-02 MED ORDER — METOCLOPRAMIDE HCL 5 MG/ML IJ SOLN
10.0000 mg | Freq: Once | INTRAMUSCULAR | Status: AC | PRN
  Administered 2016-04-02: 10 mg via INTRAVENOUS

## 2016-04-02 MED ORDER — OXYCODONE HCL 5 MG/5ML PO SOLN: 5.0000 mg | Freq: Once | ORAL | Status: DC | PRN

## 2016-04-02 MED ORDER — SUCCINYLCHOLINE CHLORIDE 20 MG/ML IJ SOLN
INTRAMUSCULAR | Status: DC | PRN
  Administered 2016-04-02: 100 mg via INTRAVENOUS

## 2016-04-02 MED ORDER — OXYCODONE HCL 5 MG PO TABS: 5.0000 mg | ORAL_TABLET | Freq: Once | ORAL | Status: DC | PRN

## 2016-04-02 MED ORDER — ROCURONIUM BROMIDE 100 MG/10ML IV SOLN
INTRAVENOUS | Status: AC
  Filled 2016-04-02: qty 1

## 2016-04-02 MED ORDER — LIDOCAINE HCL (CARDIAC) 20 MG/ML IV SOLN
INTRAVENOUS | Status: DC | PRN
  Administered 2016-04-02: 80 mg via INTRAVENOUS

## 2016-04-02 MED ORDER — HEPARIN SODIUM (PORCINE) 5000 UNIT/ML IJ SOLN
INTRAMUSCULAR | Status: AC
Start: 2016-04-02 — End: 2016-04-02
  Filled 2016-04-02: qty 1

## 2016-04-02 MED ORDER — SCOPOLAMINE 1 MG/3DAYS TD PT72
1.0000 | MEDICATED_PATCH | Freq: Once | TRANSDERMAL | Status: DC
  Administered 2016-04-02: 1.5 mg via TRANSDERMAL

## 2016-04-02 MED ORDER — FENTANYL CITRATE (PF) 250 MCG/5ML IJ SOLN
INTRAMUSCULAR | Status: AC
  Filled 2016-04-02: qty 5

## 2016-04-02 MED ORDER — BUPIVACAINE HCL (PF) 0.25 % IJ SOLN
INTRAMUSCULAR | Status: AC
  Filled 2016-04-02: qty 30

## 2016-04-02 MED ORDER — ONDANSETRON HCL 4 MG/2ML IJ SOLN
INTRAMUSCULAR | Status: AC
  Filled 2016-04-02: qty 2

## 2016-04-02 MED ORDER — SCOPOLAMINE 1 MG/3DAYS TD PT72
MEDICATED_PATCH | TRANSDERMAL | Status: AC
  Filled 2016-04-02: qty 1

## 2016-04-02 MED ORDER — ROCURONIUM BROMIDE 100 MG/10ML IV SOLN
INTRAVENOUS | Status: DC | PRN
  Administered 2016-04-02: 5 mg via INTRAVENOUS
  Administered 2016-04-02: 15 mg via INTRAVENOUS

## 2016-04-02 MED ORDER — FENTANYL CITRATE (PF) 250 MCG/5ML IJ SOLN
INTRAMUSCULAR | Status: DC | PRN
  Administered 2016-04-02 (×5): 50 ug via INTRAVENOUS

## 2016-04-02 MED ORDER — ONDANSETRON HCL 4 MG/2ML IJ SOLN: 4.0000 mg | Freq: Once | INTRAMUSCULAR | Status: DC | PRN

## 2016-04-02 MED ORDER — NEOSTIGMINE METHYLSULFATE 10 MG/10ML IV SOLN
INTRAVENOUS | Status: AC
  Filled 2016-04-02: qty 1

## 2016-04-02 MED ORDER — BUPIVACAINE HCL (PF) 0.25 % IJ SOLN
INTRAMUSCULAR | Status: DC | PRN
  Administered 2016-04-02: 10 mL

## 2016-04-02 MED ORDER — FENTANYL CITRATE (PF) 100 MCG/2ML IJ SOLN
INTRAMUSCULAR | Status: AC
  Filled 2016-04-02: qty 2

## 2016-04-02 MED ORDER — MIDAZOLAM HCL 2 MG/2ML IJ SOLN
INTRAMUSCULAR | Status: AC
  Filled 2016-04-02: qty 2

## 2016-04-02 MED ORDER — PROPOFOL 10 MG/ML IV BOLUS
INTRAVENOUS | Status: DC | PRN
  Administered 2016-04-02: 150 mg via INTRAVENOUS

## 2016-04-02 MED ORDER — NEOSTIGMINE METHYLSULFATE 10 MG/10ML IV SOLN
INTRAVENOUS | Status: DC | PRN
  Administered 2016-04-02: 2 mg via INTRAVENOUS

## 2016-04-02 MED ORDER — DEXAMETHASONE SODIUM PHOSPHATE 4 MG/ML IJ SOLN
INTRAMUSCULAR | Status: DC | PRN
  Administered 2016-04-02: 4 mg via INTRAVENOUS

## 2016-04-02 MED ORDER — PROPOFOL 10 MG/ML IV BOLUS
INTRAVENOUS | Status: AC
  Filled 2016-04-02: qty 20

## 2016-04-02 SURGICAL SUPPLY — 28 items
CABLE HIGH FREQUENCY MONO STRZ (ELECTRODE) IMPLANT
CATH ROBINSON RED A/P 16FR (CATHETERS) ×3 IMPLANT
CLOTH BEACON ORANGE TIMEOUT ST (SAFETY) ×3 IMPLANT
DRSG COVADERM PLUS 2X2 (GAUZE/BANDAGES/DRESSINGS) IMPLANT
DRSG OPSITE POSTOP 3X4 (GAUZE/BANDAGES/DRESSINGS) ×3 IMPLANT
ELECT REM PT RETURN 9FT ADLT (ELECTROSURGICAL) ×3
ELECTRODE REM PT RTRN 9FT ADLT (ELECTROSURGICAL) ×1 IMPLANT
GLOVE BIO SURGEON STRL SZ7 (GLOVE) ×3 IMPLANT
GLOVE BIOGEL PI IND STRL 7.0 (GLOVE) ×1 IMPLANT
GLOVE BIOGEL PI INDICATOR 7.0 (GLOVE) ×2
GOWN STRL REUS W/TWL LRG LVL3 (GOWN DISPOSABLE) ×6 IMPLANT
LIQUID BAND (GAUZE/BANDAGES/DRESSINGS) ×3 IMPLANT
NEEDLE INSUFFLATION 120MM (ENDOMECHANICALS) IMPLANT
NS IRRIG 1000ML POUR BTL (IV SOLUTION) IMPLANT
PACK LAPAROSCOPY BASIN (CUSTOM PROCEDURE TRAY) ×3 IMPLANT
PAD TRENDELENBURG POSITION (MISCELLANEOUS) ×3 IMPLANT
SET IRRIG TUBING LAPAROSCOPIC (IRRIGATION / IRRIGATOR) IMPLANT
SLEEVE XCEL OPT CAN 5 100 (ENDOMECHANICALS) ×3 IMPLANT
SUT VIC AB 3-0 PS2 18 (SUTURE) ×2
SUT VIC AB 3-0 PS2 18XBRD (SUTURE) ×1 IMPLANT
SUT VICRYL 0 ENDOLOOP (SUTURE) IMPLANT
SUT VICRYL 0 UR6 27IN ABS (SUTURE) IMPLANT
TOWEL OR 17X24 6PK STRL BLUE (TOWEL DISPOSABLE) ×6 IMPLANT
TROCAR BALLN 12MMX100 BLUNT (TROCAR) ×3 IMPLANT
TROCAR OPTI TIP 5M 100M (ENDOMECHANICALS) ×3 IMPLANT
TROCAR XCEL DIL TIP R 11M (ENDOMECHANICALS) IMPLANT
WARMER LAPAROSCOPE (MISCELLANEOUS) ×3 IMPLANT
WATER STERILE IRR 1000ML POUR (IV SOLUTION) ×3 IMPLANT

## 2016-04-02 NOTE — H&P (Signed)
  History and physical exam unchanged 

## 2016-04-02 NOTE — Anesthesia Procedure Notes (Signed)
Procedure Name: Intubation Date/Time: 04/02/2016 7:32 AM Performed by: Graciela HusbandsFUSSELL, Cariana Karge O Pre-anesthesia Checklist: Patient identified, Timeout performed, Emergency Drugs available, Patient being monitored and Suction available Patient Re-evaluated:Patient Re-evaluated prior to inductionOxygen Delivery Method: Circle system utilized Preoxygenation: Pre-oxygenation with 100% oxygen Intubation Type: IV induction Ventilation: Mask ventilation without difficulty Laryngoscope Size: Mac and 3 Grade View: Grade I Tube size: 7.0 mm Number of attempts: 1 Airway Equipment and Method: Stylet Placement Confirmation: ETT inserted through vocal cords under direct vision,  positive ETCO2 and breath sounds checked- equal and bilateral Secured at: 21 cm Tube secured with: Tape Dental Injury: Teeth and Oropharynx as per pre-operative assessment

## 2016-04-02 NOTE — Brief Op Note (Signed)
04/02/2016  8:20 AM  PATIENT:  Linda LeschJessica S Stevenson  25 y.o. female  PRE-OPERATIVE DIAGNOSIS:  ENDOMETRIOSIS  POST-OPERATIVE DIAGNOSIS:  ENDOMETRIOSIS  PROCEDURE:  Procedure(s): LAPAROSCOPY DIAGNOSTIC with lysis of adhesions and cautery of endometriotic implants. (N/A)  SURGEON:  Surgeon(s) and Role:    * Richardean ChimeraJohn Braxtin Bamba, MD - Primary  PHYSICIAN ASSISTANT:   ASSISTANTS: none   ANESTHESIA:   local and general  EBL:  Total I/O In: 1000 [I.V.:1000] Out: 2 [Blood:2]  BLOOD ADMINISTERED:none  DRAINS: none   LOCAL MEDICATIONS USED:  MARCAINE     SPECIMEN:  No Specimen  DISPOSITION OF SPECIMEN:  N/A  COUNTS:  YES  TOURNIQUET:  * No tourniquets in log *  DICTATION: .Other Dictation: Dictation Number [id number]  PLAN OF CARE: Discharge to home after PACU  PATIENT DISPOSITION:  PACU - hemodynamically stable.   Delay start of Pharmacological VTE agent (>24hrs) due to surgical blood loss or risk of bleeding: not applicable

## 2016-04-02 NOTE — Discharge Instructions (Signed)

## 2016-04-02 NOTE — Anesthesia Postprocedure Evaluation (Signed)
Anesthesia Post Note  Patient: Jearld LeschJessica S Crite  Procedure(s) Performed: Procedure(s) (LRB): LAPAROSCOPY DIAGNOSTIC with lysis of adhesions and cautery of endometriotic implants. (N/A)  Patient location during evaluation: PACU Anesthesia Type: General Level of consciousness: awake, awake and alert and oriented Pain management: pain level controlled Respiratory status: spontaneous breathing, nonlabored ventilation and respiratory function stable Cardiovascular status: blood pressure returned to baseline Anesthetic complications: no    Last Vitals:  Filed Vitals:   04/02/16 0616 04/02/16 0825  BP: 109/70 117/65  Pulse: 88 78  Temp: 36.7 C 36.8 C  Resp: 20 12    Last Pain: There were no vitals filed for this visit.               Jazzalyn Loewenstein COKER

## 2016-04-02 NOTE — Anesthesia Preprocedure Evaluation (Signed)
Anesthesia Evaluation    Airway Mallampati: I  TM Distance: >3 FB Neck ROM: Full    Dental  (+) Teeth Intact, Dental Advisory Given   Pulmonary    breath sounds clear to auscultation       Cardiovascular  Rhythm:Regular Rate:Normal     Neuro/Psych    GI/Hepatic   Endo/Other    Renal/GU      Musculoskeletal   Abdominal   Peds  Hematology   Anesthesia Other Findings   Reproductive/Obstetrics                             Anesthesia Physical Anesthesia Plan  ASA: I  Anesthesia Plan: General   Post-op Pain Management:    Induction: Intravenous  Airway Management Planned: Oral ETT  Additional Equipment:   Intra-op Plan:   Post-operative Plan: Extubation in OR  Informed Consent: I have reviewed the patients History and Physical, chart, labs and discussed the procedure including the risks, benefits and alternatives for the proposed anesthesia with the patient or authorized representative who has indicated his/her understanding and acceptance.   Dental advisory given  Plan Discussed with: CRNA and Anesthesiologist  Anesthesia Plan Comments:         Anesthesia Quick Evaluation

## 2016-04-02 NOTE — Transfer of Care (Signed)
Immediate Anesthesia Transfer of Care Note  Patient: Linda Stevenson  Procedure(s) Performed: Procedure(s): LAPAROSCOPY DIAGNOSTIC with lysis of adhesions and cautery of endometriotic implants. (N/A)  Patient Location: PACU  Anesthesia Type:General  Level of Consciousness: awake, alert  and oriented  Airway & Oxygen Therapy: Patient Spontanous Breathing and Patient connected to nasal cannula oxygen  Post-op Assessment: Report given to RN and Post -op Vital signs reviewed and stable  Post vital signs: Reviewed and stable  Last Vitals:  Filed Vitals:   04/02/16 0616  BP: 109/70  Pulse: 88  Temp: 36.7 C  Resp: 20    Complications: No apparent anesthesia complications

## 2016-04-03 ENCOUNTER — Encounter (HOSPITAL_COMMUNITY): Payer: Self-pay | Admitting: Obstetrics and Gynecology

## 2016-04-03 NOTE — Op Note (Signed)
Linda Stevenson, Linda Stevenson               ACCOUNT NO.:  [id number]  MEDICAL RECORD NO.:  [id number]  LOCATION:                                F[facility]ILITY:  [facility]  PHYSICIAN:  Juluis Mire, M.D.   DATE OF BIRTH:  14-May-1991  DATE OF PROCEDURE:  04/02/2016 DATE OF DISCHARGE:  04/02/2016                              OPERATIVE REPORT   PREOPERATIVE DIAGNOSIS:  Pelvic pain and dyspareunia, past history of pelvic adhesions, endometriosis due to appendicitis.  POSTOPERATIVE DIAGNOSIS:  Minimal pelvic adhesions, minimal pelvic endometriosis.  OPERATIVE PROCEDURE:  Open laparoscopy, lysis of adhesions, and cautery of endometriotic implant.  SURGEON:  Juluis Mire, MD  ANESTHESIA:  General endotracheal.  ESTIMATED BLOOD LOSS:  Minimal.  PACKS AND DRAINS:  None.  INTRAOPERATIVE BLOOD PLACED:  None.  COMPLICATIONS:  None.  INDICATIONS:  As dictated in the history and physical.  PROCEDURE IN DETAIL:  The patient was taken to the OR and placed in supine position.  After satisfactory level of general endotracheal anesthesia obtained, the patient was placed in dorsal lithotomy position.  Perineum and vagina were cleansed out with Betadine.  Bladder was emptied by in and out catheterization.  A Hulka tenaculum was put in place and secured.  The abdomen was prepped with DuraPrep.  Patient was subsequently draped in sterile field.  Subumbilical incision was made with knife and carried through subcutaneous tissue.  Fascia was identified and entered sharply, and incision in the fascia was extended laterally.  Peritoneum was entered with the help of Kelly.  Palpation through the incision revealed no evidence of adhesions around the umbilicus.  An open laparoscopic trocar was put in place and secured. The abdomen was inflated with carbon dioxide.  Laparoscope was introduced.  Visualization revealed no evidence of injury to adjacent organs.  The upper abdomen including liver, tip of the gallbladder,  and stomach were normal.  Both lateral gutters were clear.  She did have some adhesions to the abdominal wall.  Appendix was surgically absent. Next, a 5 mm trocar was put in place in suprapubic area under direct visualization.  She had some flimsy adhesions to the anterior part of the uterus.  She had a few adhesions from the bowel to the sidewall, but did not involve the tube and ovary.  Both tubes and ovaries were completely normal and free.  The fimbriated end of each tube appeared to be normal.  She had 2 implants of endometriosis on the left pelvic sidewall.  Using the scissors, we took down the incisions to the anterior uterus.  We also took down the pericecal adhesions.  We brought in the bipolar Kleppinger to obtain hemostasis and to cauterize the endometriotic implants.  We had good hemostasis and freeing of the uterus from all adhesions at this point in time.  Again tubes and ovaries looked completely normal.  At this point, the abdomen was deflated with carbon dioxide.  All trocars were removed.  Subumbilical fascia was closed with figure-of-eight of 0 Vicryl.  Skin was closed with interrupted subcuticulars of 4-0 Vicryl.  Suprapubic incisions were closed with Dermabond.  The Hulka tenaculum was then removed.  The patient taken out of  the dorsal lithotomy position.  Once alert and extubated, transferred to recovery room in good condition.  Sponge, instrument, and needle count was correct by circulating nurse.     Juluis MireJohn S. Estefania Kamiya, M.D.     JSM/MEDQ  D:  04/02/2016  T:  04/02/2016  Job:  [id number]

## 2017-12-23 NOTE — L&D Delivery Note (Signed)
Delivery Note Into birthtub at 1400 C/C/+2 at 1543 Pushing started at 1530  At 4:40 PM a viable female was delivered via Vaginal, Spontaneous (Presentation: vtx; OA to ROT  ).  APGAR: 8 / 9; weight pending.   Placenta status: patient assisted to bed for placenta delivery, S/C/I, trailing membranes, for disposal on L&D.  Cord:  3VC with the following complications: none.  Cord blood collected for typing.  Pain control: Local, nitrous, hydrotherapy Episiotomy: None Lacerations:  Bilateral labial splays and 2st degree perineal Suture Repair: 3.0 vicryl Est. Blood Loss (mL):  1000 Intermittent uterine atony, lower segment swept for several clots.  Uterotonics given - Pitocin 10 units IM, methergine 0.2 mg IM, Pitocin 20 units in LR 1L bolus, cytotec 200 mcg buccal and 600 mcg rectal Fentanyl 100 mcg IV given for comfort Atony resolved over the course of 20 min with continued management, IV fluids bolusing, and intermittent uterine massage.  Cervix inspected using sponge forceps, able to visualize well except for 9-12 o'clock position, no gross cervical lacs noted.   Mom to postpartum.  Baby to Couplet care / Skin to Skin. Continue methergine 0.2 mg PO tab q 4 hrs x 6 doses  Dr. Amado NashAlmquist updated with status and POC  Linda Stevenson, CNM 07/17/2018, 5:51 PM

## 2018-03-02 ENCOUNTER — Other Ambulatory Visit (HOSPITAL_COMMUNITY): Payer: Self-pay | Admitting: Obstetrics & Gynecology

## 2018-03-02 ENCOUNTER — Other Ambulatory Visit: Payer: Self-pay | Admitting: Obstetrics & Gynecology

## 2018-03-02 ENCOUNTER — Ambulatory Visit (HOSPITAL_COMMUNITY)
Admission: RE | Admit: 2018-03-02 | Discharge: 2018-03-02 | Disposition: A | Discharge status: Home / Self Care | Payer: BC Managed Care – PPO | Source: Ambulatory Visit | Attending: Obstetrics & Gynecology | Admitting: Obstetrics & Gynecology

## 2018-03-02 DIAGNOSIS — R52 Pain, unspecified: Secondary | ICD-10-CM

## 2018-03-02 DIAGNOSIS — Z3A21 21 weeks gestation of pregnancy: Secondary | ICD-10-CM | POA: Insufficient documentation

## 2018-03-02 DIAGNOSIS — R197 Diarrhea, unspecified: Secondary | ICD-10-CM

## 2018-03-02 DIAGNOSIS — R10819 Abdominal tenderness, unspecified site: Secondary | ICD-10-CM | POA: Insufficient documentation

## 2018-03-02 DIAGNOSIS — R1010 Upper abdominal pain, unspecified: Secondary | ICD-10-CM | POA: Insufficient documentation

## 2018-03-02 DIAGNOSIS — R110 Nausea: Secondary | ICD-10-CM | POA: Insufficient documentation

## 2018-03-02 DIAGNOSIS — O26892 Other specified pregnancy related conditions, second trimester: Secondary | ICD-10-CM | POA: Diagnosis not present

## 2018-03-03 ENCOUNTER — Ambulatory Visit (HOSPITAL_COMMUNITY): Payer: BC Managed Care – PPO

## 2018-06-18 LAB — OB RESULTS CONSOLE GBS: STREP GROUP B AG: NEGATIVE

## 2018-07-17 ENCOUNTER — Other Ambulatory Visit: Payer: Self-pay

## 2018-07-17 ENCOUNTER — Encounter (HOSPITAL_COMMUNITY): Payer: Self-pay

## 2018-07-17 ENCOUNTER — Inpatient Hospital Stay (HOSPITAL_COMMUNITY)
Admission: AD | Admit: 2018-07-17 | Discharge: 2018-07-18 | DRG: 807 | Disposition: A | Discharge status: Home / Self Care | Payer: BC Managed Care – PPO

## 2018-07-17 DIAGNOSIS — Z3483 Encounter for supervision of other normal pregnancy, third trimester: Secondary | ICD-10-CM | POA: Diagnosis present

## 2018-07-17 DIAGNOSIS — Z6791 Unspecified blood type, Rh negative: Secondary | ICD-10-CM

## 2018-07-17 DIAGNOSIS — O26899 Other specified pregnancy related conditions, unspecified trimester: Secondary | ICD-10-CM

## 2018-07-17 DIAGNOSIS — O26893 Other specified pregnancy related conditions, third trimester: Principal | ICD-10-CM | POA: Diagnosis present

## 2018-07-17 DIAGNOSIS — Z3A40 40 weeks gestation of pregnancy: Secondary | ICD-10-CM

## 2018-07-17 LAB — CBC
HCT: 37.5 % (ref 36.0–46.0)
HEMOGLOBIN: 12.2 g/dL (ref 12.0–15.0)
MCH: 28.1 pg (ref 26.0–34.0)
MCHC: 32.5 g/dL (ref 30.0–36.0)
MCV: 86.4 fL (ref 78.0–100.0)
Platelets: 297 10*3/uL (ref 150–400)
RBC: 4.34 MIL/uL (ref 3.87–5.11)
RDW: 14.2 % (ref 11.5–15.5)
WBC: 13.3 10*3/uL — ABNORMAL HIGH (ref 4.0–10.5)

## 2018-07-17 MED ORDER — MISOPROSTOL 200 MCG PO TABS: 200.0000 ug | ORAL_TABLET | Freq: Once | ORAL | Status: DC

## 2018-07-17 MED ORDER — LACTATED RINGERS IV SOLN: INTRAVENOUS | Status: DC

## 2018-07-17 MED ORDER — OXYCODONE-ACETAMINOPHEN 5-325 MG PO TABS: 2.0000 | ORAL_TABLET | ORAL | Status: DC | PRN

## 2018-07-17 MED ORDER — IBUPROFEN 600 MG PO TABS
600.0000 mg | ORAL_TABLET | Freq: Four times a day (QID) | ORAL | Status: DC
  Administered 2018-07-17 – 2018-07-18 (×5): 600 mg via ORAL
  Filled 2018-07-17 (×5): qty 1

## 2018-07-17 MED ORDER — METHYLERGONOVINE MALEATE 0.2 MG/ML IJ SOLN
INTRAMUSCULAR | Status: AC
  Administered 2018-07-17: 0.2 mg via INTRAMUSCULAR
  Filled 2018-07-17: qty 1

## 2018-07-17 MED ORDER — MISOPROSTOL 200 MCG PO TABS: 600.0000 ug | ORAL_TABLET | Freq: Once | ORAL | Status: DC

## 2018-07-17 MED ORDER — ONDANSETRON 4 MG PO TBDP
4.0000 mg | ORAL_TABLET | Freq: Once | ORAL | Status: AC
  Administered 2018-07-17: 4 mg via ORAL
  Filled 2018-07-17: qty 1

## 2018-07-17 MED ORDER — OXYCODONE-ACETAMINOPHEN 5-325 MG PO TABS
1.0000 | ORAL_TABLET | ORAL | Status: DC | PRN
  Administered 2018-07-17: 1 via ORAL
  Filled 2018-07-17: qty 1

## 2018-07-17 MED ORDER — COCONUT OIL OIL: 1.0000 "application " | TOPICAL_OIL | Status: DC | PRN

## 2018-07-17 MED ORDER — OXYTOCIN 10 UNIT/ML IJ SOLN
INTRAMUSCULAR | Status: AC
  Administered 2018-07-17: 10 [IU] via INTRAMUSCULAR
  Filled 2018-07-17: qty 1

## 2018-07-17 MED ORDER — SIMETHICONE 80 MG PO CHEW: 80.0000 mg | CHEWABLE_TABLET | ORAL | Status: DC | PRN

## 2018-07-17 MED ORDER — OXYTOCIN 40 UNITS IN LACTATED RINGERS INFUSION - SIMPLE MED
2.5000 [IU]/h | INTRAVENOUS | Status: DC
  Filled 2018-07-17: qty 1000

## 2018-07-17 MED ORDER — FENTANYL CITRATE (PF) 100 MCG/2ML IJ SOLN
100.0000 ug | INTRAMUSCULAR | Status: DC | PRN
  Administered 2018-07-17: 100 ug via INTRAVENOUS
  Filled 2018-07-17: qty 2

## 2018-07-17 MED ORDER — WITCH HAZEL-GLYCERIN EX PADS: 1.0000 "application " | MEDICATED_PAD | CUTANEOUS | Status: DC | PRN

## 2018-07-17 MED ORDER — LACTATED RINGERS IV SOLN: 500.0000 mL | INTRAVENOUS | Status: DC | PRN

## 2018-07-17 MED ORDER — FLEET ENEMA 7-19 GM/118ML RE ENEM: 1.0000 | ENEMA | Freq: Every day | RECTAL | Status: DC | PRN

## 2018-07-17 MED ORDER — PRENATAL MULTIVITAMIN CH
1.0000 | ORAL_TABLET | Freq: Every day | ORAL | Status: DC
  Administered 2018-07-18: 1 via ORAL
  Filled 2018-07-17: qty 1

## 2018-07-17 MED ORDER — ACETAMINOPHEN 325 MG PO TABS: 650.0000 mg | ORAL_TABLET | ORAL | Status: DC | PRN

## 2018-07-17 MED ORDER — METHYLERGONOVINE MALEATE 0.2 MG/ML IJ SOLN: 0.2000 mg | Freq: Once | INTRAMUSCULAR | Status: DC

## 2018-07-17 MED ORDER — METHYLERGONOVINE MALEATE 0.2 MG PO TABS
0.2000 mg | ORAL_TABLET | ORAL | Status: DC
  Administered 2018-07-17 – 2018-07-18 (×6): 0.2 mg via ORAL
  Filled 2018-07-17 (×6): qty 1

## 2018-07-17 MED ORDER — SOD CITRATE-CITRIC ACID 500-334 MG/5ML PO SOLN: 30.0000 mL | ORAL | Status: DC | PRN

## 2018-07-17 MED ORDER — MISOPROSTOL 200 MCG PO TABS
ORAL_TABLET | ORAL | Status: AC
  Administered 2018-07-17: 600 ug via RECTAL
  Administered 2018-07-17: 200 ug via BUCCAL
  Filled 2018-07-17: qty 5

## 2018-07-17 MED ORDER — DIPHENHYDRAMINE HCL 25 MG PO CAPS: 25.0000 mg | ORAL_CAPSULE | Freq: Four times a day (QID) | ORAL | Status: DC | PRN

## 2018-07-17 MED ORDER — TETANUS-DIPHTH-ACELL PERTUSSIS 5-2.5-18.5 LF-MCG/0.5 IM SUSP
0.5000 mL | Freq: Once | INTRAMUSCULAR | Status: DC
  Filled 2018-07-17: qty 0.5

## 2018-07-17 MED ORDER — OXYCODONE HCL 5 MG PO TABS
5.0000 mg | ORAL_TABLET | ORAL | Status: DC | PRN
  Administered 2018-07-17: 5 mg via ORAL
  Filled 2018-07-17: qty 1

## 2018-07-17 MED ORDER — ONDANSETRON HCL 4 MG/2ML IJ SOLN: 4.0000 mg | Freq: Four times a day (QID) | INTRAMUSCULAR | Status: DC | PRN

## 2018-07-17 MED ORDER — ONDANSETRON HCL 4 MG/5ML PO SOLN: 4.0000 mg | Freq: Once | ORAL | Status: DC

## 2018-07-17 MED ORDER — BENZOCAINE-MENTHOL 20-0.5 % EX AERO
1.0000 "application " | INHALATION_SPRAY | CUTANEOUS | Status: DC | PRN
  Filled 2018-07-17: qty 56

## 2018-07-17 MED ORDER — DIBUCAINE 1 % RE OINT
1.0000 "application " | TOPICAL_OINTMENT | RECTAL | Status: DC | PRN
  Filled 2018-07-17: qty 28

## 2018-07-17 MED ORDER — LIDOCAINE HCL (PF) 1 % IJ SOLN
30.0000 mL | INTRAMUSCULAR | Status: DC | PRN
  Administered 2018-07-17: 30 mL via SUBCUTANEOUS
  Filled 2018-07-17: qty 30

## 2018-07-17 MED ORDER — BISACODYL 10 MG RE SUPP
10.0000 mg | Freq: Every day | RECTAL | Status: DC | PRN
  Filled 2018-07-17: qty 1

## 2018-07-17 MED ORDER — FLEET ENEMA 7-19 GM/118ML RE ENEM: 1.0000 | ENEMA | RECTAL | Status: DC | PRN

## 2018-07-17 MED ORDER — OXYTOCIN 40 UNITS IN LACTATED RINGERS INFUSION - SIMPLE MED: 1.0000 m[IU]/min | INTRAVENOUS | Status: DC

## 2018-07-17 MED ORDER — DOCUSATE SODIUM 100 MG PO CAPS
100.0000 mg | ORAL_CAPSULE | Freq: Two times a day (BID) | ORAL | Status: DC
  Administered 2018-07-18: 100 mg via ORAL
  Filled 2018-07-17: qty 1

## 2018-07-17 MED ORDER — OXYTOCIN BOLUS FROM INFUSION
500.0000 mL | Freq: Once | INTRAVENOUS | Status: AC
  Administered 2018-07-17: 500 mL via INTRAVENOUS

## 2018-07-17 MED ORDER — ONDANSETRON HCL 4 MG/2ML IJ SOLN: 4.0000 mg | INTRAMUSCULAR | Status: DC | PRN

## 2018-07-17 MED ORDER — PROMETHAZINE HCL 25 MG/ML IJ SOLN: 12.5000 mg | Freq: Four times a day (QID) | INTRAMUSCULAR | Status: DC | PRN

## 2018-07-17 MED ORDER — ONDANSETRON HCL 4 MG PO TABS: 4.0000 mg | ORAL_TABLET | ORAL | Status: DC | PRN

## 2018-07-17 MED ORDER — ACETAMINOPHEN 325 MG PO TABS
650.0000 mg | ORAL_TABLET | ORAL | Status: DC | PRN
  Administered 2018-07-18: 650 mg via ORAL
  Filled 2018-07-17: qty 2

## 2018-07-17 NOTE — Anesthesia Pain Management Evaluation Note (Signed)
  CRNA Pain Management Visit Note  Patient: Linda Stevenson, 27 y.o., female  "Hello I am a member of the anesthesia team at [facility]. We have an anesthesia team available at all times to provide care throughout the hospital, including epidural management and anesthesia for C-section. I don't know your plan for the delivery whether it a natural birth, water birth, IV sedation, nitrous supplementation, doula or epidural, but we want to meet your pain goals."   1.Was your pain managed to your expectations on prior hospitalizations?   No prior hospitalizations  2.What is your expectation for pain management during this hospitalization?     Water tub  3.How can we help you reach that goal?   Record the patient's initial score and the patient's pain goal.   Pain: 10  Pain Goal: 10 [facility] wants you to be able to say your pain was always managed very well.  Linda Stevenson,Linda Stevenson 07/17/2018

## 2018-07-17 NOTE — MAU Note (Signed)
Pt having ctx since midnight. No LOF, some light bleeding, +FM, 2cm in office this week.

## 2018-07-17 NOTE — Progress Notes (Signed)
S: Laboring in water, feels uncontrolled urge to push.  Used Nitrous gas and side lying positioning in bed for progression.   O: Vitals:   07/17/18 1413 07/17/18 1441 07/17/18 1455 07/17/18 1511  BP: (!) 103/58     Pulse: 81     Resp: 16 18 16 18   Temp: 98 F (36.7 C)     TempSrc: Axillary     SpO2:      Weight:      Height:         FHT:  118 IA, + accels UC:   regular, every 3 minutes SVE:   Dilation: 9 Effacement (%): 100 Station: -1 Exam by:: Raliegh Ipatherine Stout RN   A / P: Spontaneous labor, progressing normally  Fetal Wellbeing:  Category I Pain Control:  Labor support without medications and Water tub  Anticipated MOD:  NSVD  Neta Mendsaniela C Zakiah Gauthreaux, CNM, MSN 07/17/2018, 3:37 PM

## 2018-07-17 NOTE — H&P (Signed)
OB ADMISSION/ HISTORY & PHYSICAL:  Admission Date: 07/17/2018  7:06 AM  Admit Diagnosis: LABOR    Linda Stevenson is a 27 y.o. female presenting for painful ctx and SROM diagnosed in MAU, + ferning. Painful ctx last night and again this am after sleeping through the night.  Planning waterbirth, using doula and spouse for labor support.  Denies HA/NV/RUQ pain.  Prenatal History: G1P0   EDC : 07/13/2018,  [facility] 15 w[facility], transfer from care from [facility] for WB option  Prenatal course complicated by: Excessive TWG 46 lbs Rh neg - Rhogam given at 28 wks Gallbladder colic with normal labs  Prenatal Labs: ABO, Rh:   O neg Antibody: neg Rubella:   Immune RPR:   NR HBsAg:  NR  HIV:   Neg GBS: Negative (06/27 0000)  1 hr Glucola : 100 Genetic Screening: declined Ultrasound: normal anatomy, anterior placenta  Medical / Surgical History :  Past medical history:  Past Medical History:  Diagnosis Date  . Endometriosis   . Pelvic pain in female      Past surgical history:  Past Surgical History:  Procedure Laterality Date  . APPENDECTOMY    . DIAGNOSTIC LAPAROSCOPY     endometriosis, scar tissue  . LAPAROSCOPY N/A 04/02/2016   Procedure: LAPAROSCOPY DIAGNOSTIC with lysis of adhesions and cautery of endometriotic implants.;  Surgeon: Richardean ChimeraJohn McComb, MD;  Location: [facility];  Service: Gynecology;  Laterality: N/A;  . WISDOM TOOTH EXTRACTION  07/2015     Family History: History reviewed. No pertinent family history.   Social History:  reports that she has never smoked. She has never used smokeless tobacco. She reports that she drinks about 1.2 oz of alcohol per week. She reports that she does not use drugs.   Allergies: Patient has no known allergies.   Current Medications at time of admission:  Medications Prior to Admission  Medication Sig Dispense Refill Last Dose  . diphenhydrAMINE (BENADRYL) 25 MG tablet Take 25 mg by mouth every  8 (eight) hours as needed for sleep.   07/03/2018  . Prenatal Vit-Fe Fumarate-FA (PRENATAL MULTIVITAMIN) TABS tablet Take 1 tablet by mouth daily at 12 noon.   07/16/2018 at Unknown time     Review of Systems: ROS As noted above  Physical Exam: Vital signs and nursing notes reviewed.  ED Triage Vitals  Enc Vitals Group     BP 07/17/18 0720 139/84     Pulse Rate 07/17/18 0720 100     Resp 07/17/18 0719 18     Temp 07/17/18 0719 98.5 F (36.9 C)     Temp Source 07/17/18 0719 Oral     SpO2 07/17/18 0724 100 %     Weight 07/17/18 0717 189 lb (85.7 kg)     Height 07/17/18 0719 5\' 5"  (1.651 m)     Head Circumference --      Peak Flow --      Pain Score 07/17/18 0847 7     Pain Loc --      Pain Edu? --      Excl. in GC? --      General: AAO x 3, NAD, coping adequate Heart: RRR Lungs:CTAB Abdomen: Gravid, NT, Leopold's vertex, EFW 8 lbs Extremities: trace edema Genitalia / VE: Dilation: 2 Effacement (%): 80 Cervical Position: Posterior Station: -1 Presentation: Vertex Exam by:: Raliegh Ipatherine Stout RN   FHR: 130 BPM, mod variability, + accels, - decels TOCO: Ctx q 3-4  Labs:   Pending T&S, CBC, RPR  Recent Labs    07/17/18 0904  WBC 13.3*  HGB 12.2  HCT 37.5  PLT 297       Assessment:  27 y.o. G1P0 at [redacted]w[redacted]d, SROM  1. early stage of labor 2. FHR category 1 3. GBS neg 4. Desires waterlabor/birth 5. Breastfeeding - desires 6. Placenta disposal per patient request  Plan:  1. Admit to BS 2. Routine L&D orders 3. Analgesia/anesthesia PRN, amy labor in tub  4. Expectant management 5. Anticipate NSVB   Dr Cherly Hensen notified of admission / plan of care   Neta Mends CNM, MSN 07/17/2018, 12:42 PM

## 2018-07-18 ENCOUNTER — Ambulatory Visit: Payer: Self-pay

## 2018-07-18 DIAGNOSIS — Z6791 Unspecified blood type, Rh negative: Secondary | ICD-10-CM

## 2018-07-18 DIAGNOSIS — O26899 Other specified pregnancy related conditions, unspecified trimester: Secondary | ICD-10-CM

## 2018-07-18 LAB — CBC
HEMATOCRIT: 32.4 % — AB (ref 36.0–46.0)
Hemoglobin: 10.7 g/dL — ABNORMAL LOW (ref 12.0–15.0)
MCH: 28.5 pg (ref 26.0–34.0)
MCHC: 33 g/dL (ref 30.0–36.0)
MCV: 86.4 fL (ref 78.0–100.0)
Platelets: 206 10*3/uL (ref 150–400)
RBC: 3.75 MIL/uL — ABNORMAL LOW (ref 3.87–5.11)
RDW: 14.6 % (ref 11.5–15.5)
WBC: 18.3 10*3/uL — ABNORMAL HIGH (ref 4.0–10.5)

## 2018-07-18 LAB — RPR: RPR: NONREACTIVE

## 2018-07-18 MED ORDER — RHO D IMMUNE GLOBULIN 1500 UNIT/2ML IJ SOSY
300.0000 ug | PREFILLED_SYRINGE | Freq: Once | INTRAMUSCULAR | Status: AC
  Administered 2018-07-18: 300 ug via INTRAVENOUS
  Filled 2018-07-18: qty 2

## 2018-07-18 MED ORDER — POLYSACCHARIDE IRON COMPLEX 150 MG PO CAPS: 150.0000 mg | ORAL_CAPSULE | Freq: Two times a day (BID) | ORAL | 3 refills | Status: DC

## 2018-07-18 MED ORDER — BENZOCAINE-MENTHOL 20-0.5 % EX AERO: 1.0000 "application " | INHALATION_SPRAY | CUTANEOUS | Status: DC | PRN

## 2018-07-18 MED ORDER — COCONUT OIL OIL: 1.0000 "application " | TOPICAL_OIL | 0 refills | Status: DC | PRN

## 2018-07-18 MED ORDER — ACETAMINOPHEN 325 MG PO TABS: 650.0000 mg | ORAL_TABLET | ORAL | Status: DC | PRN

## 2018-07-18 MED ORDER — IBUPROFEN 600 MG PO TABS: 600.0000 mg | ORAL_TABLET | Freq: Four times a day (QID) | ORAL | 0 refills | Status: DC

## 2018-07-18 MED ORDER — MAGNESIUM OXIDE -MG SUPPLEMENT 400 (240 MG) MG PO TABS: 400.0000 mg | ORAL_TABLET | Freq: Every day | ORAL | Status: DC

## 2018-07-18 NOTE — Progress Notes (Signed)
PPD #1 SVD w/ 1st degree perineal laceration and bilat splays  S:  Reports feeling very well             Tolerating po/ No nausea or vomiting             Bleeding is light, no clots             Pain controlled with PO meds             Up ad lib / ambulatory / voiding w/o difficulty  Newborn female "Linda Stevenson" Breastfeeding well O:                Vitals:   07/17/18 2011 07/17/18 2059 07/17/18 2331 07/18/18 0339  BP: 116/75 124/72 121/70 110/66  Pulse: 88 81 86 69  Resp: 18 18 18 18   Temp: 98.7 F (37.1 C) 98 F (36.7 C) 98.5 F (36.9 C) 98 F (36.7 C)  TempSrc: Oral Oral Oral Oral  SpO2:      Weight:      Height:        LABS:              Recent Labs    07/17/18 0904 07/18/18 0545  WBC 13.3* 18.3*  HGB 12.2 10.7*  PLT 297 206               Blood type: --/--/O NEG (07/27 0545)  Rubella:  Immune2                                Physical Exam:             Alert and oriented X3  Lungs: Clear and unlabored  Heart: regular rate and rhythm / no mumurs  Abdomen: soft, non-tender, non-distended              Fundus: firm, non-tender, U-3  Perineum: mildly edematous, well-approximated  Lochia: appropriate  Extremities: trace edema, no calf pain, cords, or tenderness    A/P: 27 y.o.G1 P1001 PPD #1 Postpartum care after vaginal birth -routine PP orders  PPH -asymptomatic -PP H/H 10.7/32.4 -monitor vaginal bleeding -reports saturation of 2 peripads in 2 hours -DC home w/ supplemental Fe  -Follow-up in 6 weeks   1st degree perineal laceration w/ bilat splays -perineal hygiene -Sitz baths PRN  RH neg mother w/ RH pos newborn -Rhogam administered 07/18/18  Linda PinkVivian Stevenson, SNM 07/18/2018 1:53 PM

## 2018-07-18 NOTE — Discharge Summary (Addendum)
OB Discharge Summary     Patient Name: Linda Stevenson DOB: [birth year] MRN: [id number]  Date of admission: 07/17/2018 Delivering MD: Arlan OrganPAUL, DANIELA C   Date of discharge: 07/18/2018  Admitting diagnosis: LABOR Intrauterine pregnancy: 417w4d     Secondary diagnosis:  Active Problems:   SVD/Waterbirth 7/26   Postpartum hemorrhage 1000 EBL   Postpartum care following vaginal delivery   Obstetrical laceration - bilateral labial splays and 1st deg perineal   Rh negative, maternal / newborn Rh pos  Additional problems:  Patient Active Problem List   Diagnosis Date Noted  . Rh negative, maternal / newborn Rh pos 07/18/2018  . SVD/Waterbirth 7/26 07/17/2018  . Postpartum hemorrhage 1000 EBL 07/17/2018  . Postpartum care following vaginal delivery 07/17/2018  . Obstetrical laceration - bilateral labial splays and 1st deg perineal 07/17/2018  . Pelvic pain in female 04/02/2016    Class: Present on Admission  . Pelvic adhesions 04/02/2016    Class: Present on Admission  . Endometriosis 04/02/2016    Class: Present on Admission  . 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 CLOSTRIDIUM DIFFICILE COLITIS, HX OF 09/20/2010  . APPENDECTOMY, HX OF 09/20/2010  . 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 OTHER SPECIFIED CARDIAC DYSRHYTHMIAS 09/10/2010  . IBS 09/10/2010  . ENDOMETRIOSIS, SITE UNSPECIFIED 09/10/2010  . SCOLIOSIS , IDIOPATHIC 09/10/2010        Discharge diagnosis: Term Pregnancy Delivered, PPH and RH neg mother w/ RH pos newborn                                                                                                Post partum procedures:rhogam  Augmentation: None  Complications: Hemorrhage>10300mL  Hospital course:  Onset of Labor With Vaginal Delivery     27 y.o. yo G1P1001 at 687w4d was admitted in Latent Labor on 07/17/2018. Patient had an uncomplicated labor course as follows:  Membrane Rupture Time/Date: 7:00 AM  ,07/17/2018   Intrapartum Procedures: Episiotomy: None [1]                                         Lacerations:  None [1]  Patient had a delivery of a Viable infant. 07/17/2018  Information for the patient's newborn:  Linda Stevenson [[id number]][[id number]]  Delivery Method: Vag-Spont    Pateint had an uncomplicated postpartum course.  She is ambulating, tolerating a regular diet, passing flatus, and urinating well. Patient is discharged home in stable condition on 07/18/18.   Physical exam  Vitals:   07/17/18 2011 07/17/18 2059 07/17/18 2331 07/18/18 0339  BP: 116/75 124/72 121/70 110/66  Pulse: 88 81 86 69  Resp: 18 18 18 18   Temp: 98.7 F (37.1 C) 98 F (36.7 C) 98.5 F (36.9 C) 98 F (36.7 C)  TempSrc: Oral Oral Oral Oral  SpO2:      Weight:      Height:       General: alert, cooperative and no distress Lochia: appropriate Uterine Fundus:  firm Perineum: Healing well with no significant edema DVT Evaluation: Trace edema, no calf pain, cords, or tenderness Labs: Lab Results  Component Value Date   WBC 18.3 (H) 07/18/2018   HGB 10.7 (L) 07/18/2018   HCT 32.4 (L) 07/18/2018   MCV 86.4 07/18/2018   PLT 206 07/18/2018   CMP Latest Ref Rng & Units 12/19/2008  Glucose 70 - 99 mg/dL 87  BUN 6 - 23 mg/dL 9  Creatinine 0.4 - 1.2 mg/dL 1.61  Sodium 096 - 045 mEq/L 128(L)  Potassium 3.5 - 5.1 mEq/L 4.0  Chloride 96 - 112 mEq/L 97  CO2 19 - 32 mEq/L 23  Calcium 8.4 - 10.5 mg/dL 8.7    Discharge instruction: per After Visit Summary and "Baby and Me Booklet".  After visit meds:  Allergies as of 07/18/2018   No Known Allergies     Medication List    TAKE these medications   acetaminophen 325 MG tablet Commonly known as:  TYLENOL Take 2 tablets (650 mg total) by mouth every 4 (four) hours as needed (for pain scale < 4).   benzocaine-Menthol 20-0.5 % Aero Commonly known as:  DERMOPLAST Apply 1 application topically as needed for irritation (perineal discomfort).    coconut oil Oil Apply 1 application topically as needed.   diphenhydrAMINE 25 MG tablet Commonly known as:  BENADRYL Take 25 mg by mouth every 8 (eight) hours as needed for sleep.   ibuprofen 600 MG tablet Commonly known as:  ADVIL,MOTRIN Take 1 tablet (600 mg total) by mouth every 6 (six) hours.   iron polysaccharides 150 MG capsule Commonly known as:  NU-IRON Take 1 capsule (150 mg total) by mouth 2 (two) times daily.   Magnesium Oxide 400 (240 Mg) MG Tabs Take 1 tablet (400 mg total) by mouth daily. For prevention of constipation.   prenatal multivitamin Tabs tablet Take 1 tablet by mouth daily at 12 noon.            Discharge Care Instructions  (From admission, onward)        Start     Ordered   07/18/18 0000  Discharge wound care:    Comments:  Sitz baths 2 times /day with warm water x 1 week   07/18/18 1420       Diet: routine diet  Activity: Advance as tolerated. Pelvic rest for 6 weeks.   Outpatient follow up:  Follow-up Information    Marlinda Mike, CNM. Schedule an appointment as soon as possible for a visit in 6 week(s).   Specialty:  Obstetrics and Gynecology Contact information: [address] [place] [address] [phone]          Postpartum contraception: Not Discussed  Newborn Data: Live born female "Linda Stevenson" Birth Weight: 7 lb 0.9 oz (3200 g) APGAR: 8, 9  Newborn Delivery   Birth date/time:  07/17/2018 16:40:00 Delivery type:  Vaginal, Spontaneous     Baby Feeding: Breast Disposition:home with mother  [name], [facility] 07/18/2018 2:01 PM   Medical screening examination/treatment/procedure(s) were conducted as a shared visit with non-physician practitioner(s) and myself.  I personally evaluated the patient during the encounter.   Neta Mends, CNM, MSN 07/18/2018, 5:29 PM

## 2018-07-18 NOTE — Lactation Note (Addendum)
This note was copied from a baby's chart. Lactation Consultation Note: Lactation brochure given with review of basic breastfeeding. Mother reports that she took breastfeeding classes. She reports that infant has been breastfeeding well. Infant has had 5 feedings with several attempts and one Latch score of 7.  Infant is sleeping in mothers arms  skin to skin.  Assistance offered to hand express colostrum. Mother reports that she has been hand expressing colostrum for one month amd has good flow of colostrum.  Mother offered to assist with rousing infant to observe a feeding. Mother declined at this time. She will call for staff nurse when infant wakes for feeding to do a latch score.   Mother denies having any difficulty with latching . She denies pain with latch. Mother reports that she prefers to do cradle hold.  Discussed different positions to aid in infant having a good deep latch. Mother did inquire about S/S of Mastitis. Information given.   Encouraged mother to cue base feed and feed infant 8-12 times in 24 hours. Discussed cluster feeding.  Mother reports that she wants to be discharged this pm. Reviewed treatment and prevention of engorgement.   [facility] ,[facility], outpatient dept.and 24/7 phone service for breastfeeding questions or concerns. Mother receptive to teaching.  [facility].   Patient Name: Linda Stevenson [id number]'XToday's Date: 07/18/2018 Reason for consult: Initial assessment   Maternal Data    Feeding Feeding Type: (mother declined assistance to rouse infant for latch check)  LATCH Score                   Interventions Interventions: Breast feeding basics reviewed;Skin to skin;Hand express;Position options  Lactation Tools Discussed/Used     Consult Status Consult Status: Follow-up Date: 07/18/18 Follow-up type: In-patient    Stevan BornKendrick, Sandro Burgo [facility] 07/18/2018, 12:05  PM

## 2018-07-18 NOTE — Lactation Note (Signed)
This note was copied from a baby's chart. Lactation Consultation Note  Patient Name: Linda Stevenson Reason for consult: Follow-up assessment  P1 mother whose infant is now 4728 hours old.  Mother was not able to go home today due to baby's bilirubin levels.    Spoke with mother about breastfeeding and baby's current bilirubin level.  Mother feels like breastfeeding is going well so far and she can perform hand expression.  I offered to assist with latching and she accepted.    Mother stated she prefers the cradle position.  After explaining about the benefit and ease of keeping baby awake with the football position she was very willing to try.  Mother's breasts are soft and non tender with erect nipples.  After positioning mother, I was able to latch baby onto the right breast in the football hold with ease.  Lips were flanged and rhythmic sucking noted with audible swallows.  Mother commented on how she really liked this new position and how easy it was for her.  Also offered to initiate the DEBP to help increase milk supply to supplement baby with to help decrease the likelihood of having to have phototherapy.  Baby is to have a bilirubin level drawn at 2300 tonight and 0500 in the a.m.  Mother very willing to try.  Pump parts, assembly, disassembly and cleaning discussed.  Father present and supportive and willing to help.  Colostrum container with spoon provided to feed back any EBM mother obtains.  If she gets a larger volume when pumping mother will call for an alternative feeding method.    Encouraged to feed 8-12 times/24 hours or sooner if baby shows cues.  Suggested parents awaken at least every 3 hours if she does not self awaken due to jaundice level.  Continue to do STS, breast massage and hand expression before and after pumping.  Mother will continue to pump after feedings throughout the night.  She is happy to do anything she can to help improve baby's  outcome.  Mother will call for assistance as needed.  RN updated.   Maternal Data Formula Feeding for Exclusion: No Has patient been taught Hand Expression?: Yes Does the patient have breastfeeding experience prior to this delivery?: No  Feeding Feeding Type: Breast Fed Length of feed: 15 min  LATCH Score Latch: Grasps breast easily, tongue down, lips flanged, rhythmical sucking.  Audible Swallowing: A few with stimulation  Type of Nipple: Everted at rest and after stimulation  Comfort (Breast/Nipple): Soft / non-tender  Hold (Positioning): Assistance needed to correctly position infant at breast and maintain latch.  LATCH Score: 8  Interventions Interventions: Breast feeding basics reviewed;Assisted with latch;Skin to skin;Breast massage;Hand express;Position options;Expressed milk;Support pillows;Adjust position;Breast compression;DEBP  Lactation Tools Discussed/Used Tools: Pump Breast pump type: Double-Electric Breast Pump WIC Program: No Pump Review: Setup, frequency, and cleaning;Milk Storage Initiated by:: Linda Stevenson Date initiated:: 07/18/18   Consult Status Consult Status: Follow-up Date: 07/19/18 Follow-up type: In-patient    Dora SimsBeth R Hiroshi Krummel Stevenson, 8:53 PM

## 2018-07-19 ENCOUNTER — Ambulatory Visit: Payer: Self-pay

## 2018-07-19 LAB — TYPE AND SCREEN
ABO/RH(D): O NEG
ANTIBODY SCREEN: POSITIVE
UNIT DIVISION: 0
Unit division: 0

## 2018-07-19 LAB — BPAM RBC
BLOOD PRODUCT EXPIRATION DATE: 201908212359
Blood Product Expiration Date: 201908212359
UNIT TYPE AND RH: 9500
Unit Type and Rh: 9500

## 2018-07-19 LAB — RH IG WORKUP (INCLUDES ABO/RH)
ABO/RH(D): O NEG
Fetal Screen: NEGATIVE
Gestational Age(Wks): 40.3
UNIT DIVISION: 0

## 2018-07-19 NOTE — Lactation Note (Signed)
This note was copied from a baby's chart. Lactation Consultation Note  Patient Name: Linda Stevenson ZOXWR'UToday's Date: 07/19/2018 Reason for consult: Follow-up assessment;Infant weight loss;Hyperbilirubinemia  Visited with P1 Mom of term baby at 8241 hr of age.  Baby at 8% weight loss.  Bilirubin level being watched.  Level to be checked at 2pm, and baby reweighed. Mom hand expressed 7 ml of colostrum and spoon fed baby prior to [facility] assisting/assessing latch to breast. Baby positioned and Mom assisted with using good breast support and sandwiching.  Baby latches easily, Mom denies any discomfort.  Regular and vigorous suck/swallowing identified.  Mom taught to use alternate breast compression during feeding. Encouraged Mom to double pump and hand express after feeding to offer EBM by spoon after breast. Mom aware of engorgement prevention and treatment.  Mom aware of OP lactation support available to her. To call prn for assistance.  Interventions Interventions: Breast feeding basics reviewed;Assisted with latch;Skin to skin;Breast massage;Hand express;Breast compression;Adjust position;Support pillows;Position options;Expressed milk;DEBP  Lactation Tools Discussed/Used Tools: Pump Breast pump type: Double-Electric Breast Pump   Consult Status Consult Status: Follow-up Date: 07/19/18 Follow-up type: In-patient    Judee ClaraSmith, Emmajean Ratledge E 07/19/2018, 10:19 AM

## 2022-10-01 ENCOUNTER — Ambulatory Visit: Payer: 59 | Admitting: Physician Assistant

## 2022-10-01 ENCOUNTER — Encounter: Payer: Self-pay | Admitting: Physician Assistant

## 2022-10-01 VITALS — BP 109/76 | HR 77 | Temp 98.4°F | Ht 65.0 in | Wt 148.2 lb

## 2022-10-01 DIAGNOSIS — L209 Atopic dermatitis, unspecified: Secondary | ICD-10-CM | POA: Diagnosis not present

## 2022-10-01 DIAGNOSIS — F419 Anxiety disorder, unspecified: Secondary | ICD-10-CM | POA: Diagnosis not present

## 2022-10-01 NOTE — Patient Instructions (Addendum)
Welcome to [facility]! It was a pleasure meeting you today.  Dr. Theodis Shove for counseling at our office -  call [phone] to schedule.  As discussed, Please schedule a 6 month follow up visit today.  PLEASE NOTE:  If you had any LAB tests please let us know if you have not heard back within a few days. You may see your results on MyChart before we have a chance to review them but we will give you a call once they are reviewed by [place]. If we ordered any REFERRALS today, please let us know if you have not heard from their office within the next two weeks. Let us know through MyChart if you are needing REFILLS, or have your pharmacy send [place] the request. You can also use MyChart to communicate with me or any office staff.  Please try these tips to maintain a healthy lifestyle:  Eat most of your calories during the day when you are active. Eliminate processed foods including packaged sweets (pies, cakes, cookies), reduce intake of potatoes, white bread, white pasta, and white rice. Look for whole grain options, oat flour or almond flour.  Each meal should contain half fruits/vegetables, one quarter protein, and one quarter carbs (no bigger than a computer mouse).  Cut down on sweet beverages. This includes juice, soda, and sweet tea. Also watch fruit intake, though this is a healthier sweet option, it still contains natural sugar! Limit to 3 servings daily.  Drink at least 1 glass of water with each meal and aim for at least 8 glasses (64 ounces) per day.  Exercise at least 150 minutes every week to the best of your ability.    Take Care,  Virgina Deakins, PA-C

## 2022-10-01 NOTE — Progress Notes (Signed)
   Subjective:    Patient ID: Linda Stevenson, female    DOB: August 26, 1991, 31 y.o.   MRN: [id number]  Chief Complaint  Patient presents with   Establish Care    HPI 31 y.o. patient presents today for new patient establishment with me.  Patient hasn't had a PCP in awhile. Currently breastfeeding. 2 & 83 yo children at home, also works part-time as Journalist, newspaper. Husband works from home as well.   Current Care Team: Dermatology specialists - atopic dermatitis   Acute Concerns: Some history of anxiety, postpartum anxiety, doing better, considering counseling again to keep up to date    Past Medical History:  Diagnosis Date   Anxiety 03/04/2019   Endometriosis    Pelvic pain in female     Past Surgical History:  Procedure Laterality Date   APPENDECTOMY     DIAGNOSTIC LAPAROSCOPY     endometriosis, scar tissue   LAPAROSCOPY N/A 04/02/2016   Procedure: LAPAROSCOPY DIAGNOSTIC with lysis of adhesions and cautery of endometriotic implants.;  Surgeon: Arvella Nigh, MD;  Location: [facility];  Service: Gynecology;  Laterality: N/A;   WISDOM TOOTH EXTRACTION  07/24/2015    Family History  Problem Relation Age of Onset   Anxiety disorder Mother    Depression Mother    Rheum arthritis Father    Depression Sister    Skin cancer Maternal Grandmother    Dementia Maternal Grandmother    Early death Maternal Grandfather    Lung cancer Maternal Grandfather    Breast cancer Paternal Grandmother    Lymphoma Paternal Grandfather     Social History   Tobacco Use   Smoking status: Never   Smokeless tobacco: Never  Substance Use Topics   Alcohol use: Not Currently    Alcohol/week: 2.0 standard drinks of alcohol    Types: 2 Cans of beer per week    Comment: occasional beer   Drug use: No     No Known Allergies  Review of Systems NEGATIVE UNLESS OTHERWISE INDICATED IN HPI      Objective:     BP 109/76   Pulse 77   Temp 98.4 F (36.9 C) (Temporal)   Ht 5\' 5"  (1.651 m)   Wt 148  lb 3.2 oz (67.2 kg)   LMP 09/27/2022 (Exact Date)   SpO2 100%   Breastfeeding Yes   BMI 24.66 kg/m   Wt Readings from Last 3 Encounters:  10/01/22 148 lb 3.2 oz (67.2 kg)  07/17/18 189 lb (85.7 kg)  03/25/16 141 lb 6 oz (64.1 kg)    BP Readings from Last 3 Encounters:  10/01/22 109/76  07/18/18 110/66  04/02/16 108/71     Physical Exam     Assessment & Plan:  Atopic dermatitis, unspecified type  Anxiety   Pleasant new 31 yo patient. Well in general, no current medications. Seeing dermatology specialists for atopic dermatitis - hx of keloids as well. Mild anxiety history, considering counseling. Provided contact info for Dr. Theodis Shove at our office. Pt knows to call sooner if any concerns.    Return in about 6 months (around 04/02/2023) for well woman, labs .    Syon Tews M Reginna Sermeno, PA-C

## 2024-01-01 ENCOUNTER — Other Ambulatory Visit (HOSPITAL_COMMUNITY)
Admission: RE | Admit: 2024-01-01 | Discharge: 2024-01-01 | Disposition: A | Discharge status: Home / Self Care | Payer: 59 | Source: Ambulatory Visit | Attending: Physician Assistant | Admitting: Physician Assistant

## 2024-01-01 ENCOUNTER — Encounter: Payer: Self-pay | Admitting: Physician Assistant

## 2024-01-01 ENCOUNTER — Ambulatory Visit (INDEPENDENT_AMBULATORY_CARE_PROVIDER_SITE_OTHER): Payer: 59 | Admitting: Physician Assistant

## 2024-01-01 VITALS — BP 109/71 | HR 85 | Temp 98.2°F | Ht 65.0 in | Wt 160.8 lb

## 2024-01-01 DIAGNOSIS — Z124 Encounter for screening for malignant neoplasm of cervix: Secondary | ICD-10-CM | POA: Insufficient documentation

## 2024-01-01 DIAGNOSIS — Z13220 Encounter for screening for lipoid disorders: Secondary | ICD-10-CM | POA: Diagnosis not present

## 2024-01-01 DIAGNOSIS — Z8489 Family history of other specified conditions: Secondary | ICD-10-CM | POA: Diagnosis not present

## 2024-01-01 DIAGNOSIS — Z0000 Encounter for general adult medical examination without abnormal findings: Secondary | ICD-10-CM

## 2024-01-01 LAB — COMPREHENSIVE METABOLIC PANEL
ALT: 9 U/L (ref 0–35)
AST: 13 U/L (ref 0–37)
Albumin: 4.8 g/dL (ref 3.5–5.2)
Alkaline Phosphatase: 45 U/L (ref 39–117)
BUN: 12 mg/dL (ref 6–23)
CO2: 25 meq/L (ref 19–32)
Calcium: 9.2 mg/dL (ref 8.4–10.5)
Chloride: 106 meq/L (ref 96–112)
Creatinine, Ser: 0.8 mg/dL (ref 0.40–1.20)
GFR: 97.52 mL/min (ref >60.00)
Glucose, Bld: 100 mg/dL — ABNORMAL HIGH (ref 70–99)
Potassium: 4.2 meq/L (ref 3.5–5.1)
Sodium: 138 meq/L (ref 135–145)
Total Bilirubin: 0.6 mg/dL (ref 0.2–1.2)
Total Protein: 7.3 g/dL (ref 6.0–8.3)

## 2024-01-01 LAB — TSH: TSH: 3.16 u[IU]/mL (ref 0.35–5.50)

## 2024-01-01 LAB — LIPID PANEL
Cholesterol: 148 mg/dL (ref 0–200)
HDL: 46.8 mg/dL (ref >39.00)
LDL Cholesterol: 92 mg/dL (ref 0–99)
NonHDL: 101.04
Total CHOL/HDL Ratio: 3
Triglycerides: 46 mg/dL (ref 0.0–149.0)
VLDL: 9.2 mg/dL (ref 0.0–40.0)

## 2024-01-01 LAB — CBC WITH DIFFERENTIAL/PLATELET
Basophils Absolute: 0 10*3/uL (ref 0.0–0.1)
Basophils Relative: 1.1 % (ref 0.0–3.0)
Eosinophils Absolute: 0.2 10*3/uL (ref 0.0–0.7)
Eosinophils Relative: 3.9 % (ref 0.0–5.0)
HCT: 43 % (ref 36.0–46.0)
Hemoglobin: 14.7 g/dL (ref 12.0–15.0)
Lymphocytes Relative: 24.1 % (ref 12.0–46.0)
Lymphs Abs: 1.1 10*3/uL (ref 0.7–4.0)
MCHC: 34.1 g/dL (ref 30.0–36.0)
MCV: 93.5 fL (ref 78.0–100.0)
Monocytes Absolute: 0.3 10*3/uL (ref 0.1–1.0)
Monocytes Relative: 6.8 % (ref 3.0–12.0)
Neutro Abs: 2.8 10*3/uL (ref 1.4–7.7)
Neutrophils Relative %: 64.1 % (ref 43.0–77.0)
Platelets: 244 10*3/uL (ref 150.0–400.0)
RBC: 4.59 Mil/uL (ref 3.87–5.11)
RDW: 13 % (ref 11.5–15.5)
WBC: 4.4 10*3/uL (ref 4.0–10.5)

## 2024-01-01 NOTE — Progress Notes (Signed)
 Patient ID: Linda Stevenson, female    DOB: 1991/07/19, 33 y.o.   MRN: [id number]   Assessment & Plan:  Annual physical exam -     CBC with Differential/Platelet -     Comprehensive metabolic panel -     Lipid panel -     TSH  Cervical cancer screening -     Cytology - PAP  Family health problem    Age-appropriate screening and counseling performed today. Will check labs and call with results. Advised her to look into counseling while her FIL is going through cancer. Preventive measures discussed and printed in AVS for patient.   Patient Counseling: [x]   Nutrition: Stressed importance of moderation in sodium/caffeine intake, saturated fat and cholesterol, caloric balance, sufficient intake of fresh fruits, vegetables, and fiber.  [x]   Stressed the importance of regular exercise.   [x]   Substance Abuse: Discussed cessation/primary prevention of tobacco, alcohol, or other drug use; driving or other dangerous activities under the influence; availability of treatment for abuse.   []   Injury prevention: Discussed safety belts, safety helmets, smoke detector, smoking near bedding or upholstery.   []   Sexuality: Discussed sexually transmitted diseases, partner selection, use of condoms, avoidance of unintended pregnancy  and contraceptive alternatives.   [x]   Dental health: Discussed importance of regular tooth brushing, flossing, and dental visits.  [x]   Health maintenance and immunizations reviewed. Please refer to Health maintenance section.        Return in about 1 year (around 12/31/2024) for physical.    Subjective:    Chief Complaint  Patient presents with   Annual Exam    Pt here for annual exam  w/o any concerns - non fasting, pap due today, declined flu vaccine    Anxiety     Patient is in today for well woman exam. Doing well overall. Tries best to take care of her health. No recent illness. Hx normal pap smears. Regular cycles. Monogamous.   Some  heightened anxiety lately.  Both parents in poor health. FIL diagnosed with Leukemia in the last few weeks. Married and also has small children 54 yo son & 84 yo daughter. Good friend support, enjoys her job.    Past Medical History:  Diagnosis Date   Anxiety 03/04/2019   Endometriosis    Pelvic pain in female     Past Surgical History:  Procedure Laterality Date   APPENDECTOMY     DIAGNOSTIC LAPAROSCOPY     endometriosis, scar tissue   LAPAROSCOPY N/A 04/02/2016   Procedure: LAPAROSCOPY DIAGNOSTIC with lysis of adhesions and cautery of endometriotic implants.;  Surgeon: Norleen Skill, MD;  Location: [facility];  Service: Gynecology;  Laterality: N/A;   WISDOM TOOTH EXTRACTION  07/24/2015    Family History  Problem Relation Age of Onset   Anxiety disorder Mother    Depression Mother    Rheum arthritis Father    Arthritis Father    Depression Sister    Skin cancer Maternal Grandmother    Dementia Maternal Grandmother    Early death Maternal Grandfather    Lung cancer Maternal Grandfather    Breast cancer Paternal Grandmother    Lymphoma Paternal Grandfather     Social History   Tobacco Use   Smoking status: Never   Smokeless tobacco: Never  Substance Use Topics   Alcohol use: Yes    Alcohol/week: 3.0 standard drinks of alcohol    Types: 2 Cans of beer, 1 Shots of liquor per week  Comment: occasional beer   Drug use: No     No Known Allergies  Review of Systems NEGATIVE UNLESS OTHERWISE INDICATED IN HPI      Objective:     BP 109/71   Pulse 85   Temp 98.2 F (36.8 C)   Ht 5' 5 (1.651 m)   Wt 160 lb 12.8 oz (72.9 kg)   LMP 12/03/2023   SpO2 99%   BMI 26.76 kg/m   Wt Readings from Last 3 Encounters:  01/01/24 160 lb 12.8 oz (72.9 kg)  10/01/22 148 lb 3.2 oz (67.2 kg)  07/17/18 189 lb (85.7 kg)    BP Readings from Last 3 Encounters:  01/01/24 109/71  10/01/22 109/76  07/18/18 110/66     Physical Exam Vitals and nursing note reviewed. Exam  conducted with a chaperone present.  Constitutional:      Appearance: Normal appearance. She is normal weight. She is not toxic-appearing.  HENT:     Head: Normocephalic and atraumatic.     Right Ear: Tympanic membrane, ear canal and external ear normal.     Left Ear: Tympanic membrane, ear canal and external ear normal.     Nose: Nose normal.     Mouth/Throat:     Mouth: Mucous membranes are moist.  Eyes:     Extraocular Movements: Extraocular movements intact.     Conjunctiva/sclera: Conjunctivae normal.     Pupils: Pupils are equal, round, and reactive to light.  Neck:     Thyroid : No thyroid  mass, thyromegaly or thyroid  tenderness.  Cardiovascular:     Rate and Rhythm: Normal rate and regular rhythm.     Pulses: Normal pulses.     Heart sounds: Normal heart sounds.  Pulmonary:     Effort: Pulmonary effort is normal.     Breath sounds: Normal breath sounds.  Chest:     Chest wall: No mass.  Breasts:    Right: Normal. No swelling, bleeding, inverted nipple, mass, nipple discharge, skin change or tenderness.     Left: Normal. No swelling, bleeding, inverted nipple, mass, nipple discharge, skin change or tenderness.  Abdominal:     General: Abdomen is flat. Bowel sounds are normal.     Palpations: Abdomen is soft.  Genitourinary:    General: Normal vulva.     Labia:        Right: No rash, tenderness or lesion.        Left: No rash, tenderness or lesion.      Vagina: Normal.     Cervix: Normal.     Uterus: Normal.      Adnexa: Right adnexa normal and left adnexa normal.  Musculoskeletal:        General: Normal range of motion.     Cervical back: Normal range of motion and neck supple.     Right lower leg: No edema.     Left lower leg: No edema.  Lymphadenopathy:     Cervical: No cervical adenopathy.     Upper Body:     Right upper body: No supraclavicular, axillary or pectoral adenopathy.     Left upper body: No supraclavicular, axillary or pectoral adenopathy.   Skin:    General: Skin is warm and dry.     Findings: No lesion.  Neurological:     General: No focal deficit present.     Mental Status: She is alert and oriented to person, place, and time.  Psychiatric:        Mood and Affect:  Mood normal.        Behavior: Behavior normal.        Thought Content: Thought content normal.        Judgment: Judgment normal.        Tashanti Dalporto M Kylor Valverde, PA-C

## 2024-01-02 LAB — CYTOLOGY - PAP
Comment: NEGATIVE
Diagnosis: NEGATIVE
High risk HPV: NEGATIVE

## 2024-05-25 ENCOUNTER — Encounter: Payer: Self-pay | Admitting: Allergy & Immunology

## 2024-05-25 ENCOUNTER — Other Ambulatory Visit: Payer: Self-pay

## 2024-05-25 ENCOUNTER — Ambulatory Visit: Payer: Self-pay | Admitting: Allergy & Immunology

## 2024-05-25 VITALS — BP 122/78 | HR 97 | Temp 98.9°F | Resp 18 | Ht 65.0 in | Wt 162.2 lb

## 2024-05-25 DIAGNOSIS — L235 Allergic contact dermatitis due to other chemical products: Secondary | ICD-10-CM | POA: Diagnosis not present

## 2024-05-25 DIAGNOSIS — R21 Rash and other nonspecific skin eruption: Secondary | ICD-10-CM | POA: Diagnosis not present

## 2024-05-25 NOTE — Progress Notes (Signed)
 NEW PATIENT  Date of Service/Encounter:  05/25/24  Consult requested by: Allwardt, Deleta Felix, PA-C   Assessment:   Rash  Allergic dermatitis due to other chemical product - recommended doing a NAC 80 patch testing series  Plan/Recommendations:   1. Rash - with confirmed allergic contact dermatitis - We may consider doing a more extensive patch testing done. - We have recently started doing a panel containing 80 different items on it. - We will get you scheduled for that.  - We are going to get some blood work to rule out weird causes of itching/hives.   2. Return in about 2 weeks (around 06/08/2024). You can have the follow up appointment with Dr. Idolina Maker or a Nurse Practicioner (our Nurse Practitioners are excellent and always have Physician oversight!).    This note in its entirety was forwarded to the Provider who requested this consultation.  Subjective:   Linda Stevenson is a 33 y.o. Stevenson presenting today for evaluation of  Chief Complaint  Patient presents with   Establish Care    Patient presents with recurrent rash associated with burning, itching and sometimes blisters up.     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  Discussed the use of AI scribe software for clinical note transcription with the patient and/or guardian, who gave verbal consent to proceed.  Linda Stevenson was referred by Allwardt, Deleta Felix, PA-C.     Linda Stevenson is a 33 y.o. Stevenson presenting for an evaluation of a rash.  Discussed the use of AI scribe software for clinical note transcription with the patient, who gave verbal consent to proceed.  History of Present Illness   Linda Stevenson is a 33 year old Stevenson who presents with a persistent rash.  She has experienced a recurrent rash for the past 15 years, characterized by blistering and swelling, lasting for two to three weeks. The rash primarily affects her arms, face, neck, stomach, and back, occurring twice a year, typically in the spring and fall, often associated with significant life changes or stress. She can go up to six months without it.   She has undergone extensive workup, including visits to dermatology specialists and patch testing. The most recent pathology report from Apr 30, 2024, confirmed an allergic contact reaction. Patch testing in November 2024 suggested a possible allergy to gold as imidazolidinyl urea. although she has ensured that gold is not present in her home environment for the past three years.  She has also avoided all products with the urea compound. She has triamcinolone to use as needed. Prednisone seemed to make it worse. She is not sure what is going on with this.   Her symptoms have progressively worsened over time, with the most recent episode being  particularly severe, leading to a near breakdown. She has used triamcinolone ointment and received a prednisone shot, which exacerbated the rash. She also tried taking omeprazole and antihistamines (two Allegra, two Claritin, and two Benadryl  daily) to manage the symptoms, but the rash resolved within the usual three-week period.  No environmental allergies, food allergies, or  respiratory symptoms such as sneezing, itchy eyes, or runny nose. No history of asthma since childhood and no recent sinus or ear infections.  Her family history is significant for her father having rheumatoid arthritis. She has an older sister who is frequently ill but smokes, which she attributes to her sister's health issues.  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 She is estranged from her mother but maintains a relationship with her father. 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     Otherwise, there is no history of other atopic diseases, including asthma, food allergies, drug allergies, environmental allergies, stinging insect allergies, or contact dermatitis. There is no significant infectious history. Vaccinations are up to date.    Past Medical History: Patient Active Problem List   Diagnosis Date Noted   Atopic dermatitis 10/01/2022   Anxiety 10/01/2022   Rh negative, maternal / newborn Rh pos 07/18/2018   SVD/Waterbirth 7/26 07/17/2018   Postpartum hemorrhage 1000 EBL 07/17/2018   Postpartum care following vaginal delivery 07/17/2018   Obstetrical laceration - bilateral labial splays and 1st deg perineal 07/17/2018   Pelvic adhesions 04/02/2016    Class: Present on Admission   Endometriosis 04/02/2016    Class: Present on Admission   Perioral dermatitis 05/08/2012   CLOSTRIDIUM DIFFICILE COLITIS, HX OF 09/20/2010   APPENDECTOMY, HX OF 09/20/2010   ENDOMETRIOSIS, SITE UNSPECIFIED 09/10/2010   Other acne 08/22/2010    Medication List:  Allergies as of 05/25/2024   No Known Allergies      Medication List    as of May 25, 2024  2:51 PM   You have not been prescribed any medications.     Birth History: non-contributory  Developmental History: non-contributory  Past Surgical History: Past Surgical History:  Procedure Laterality Date   APPENDECTOMY     DIAGNOSTIC LAPAROSCOPY     endometriosis, scar  tissue   LAPAROSCOPY N/A 04/02/2016   Procedure: LAPAROSCOPY DIAGNOSTIC with lysis of adhesions and cautery of endometriotic implants.;  Surgeon: Merryl Abraham, MD;  Location: [facility];  Service: Gynecology;  Laterality: N/A;   WISDOM TOOTH EXTRACTION  07/24/2015     Family History: Family History  Problem Relation Age of Onset   Anxiety disorder Mother    Depression Mother    Rheum arthritis Father    Arthritis Father    Depression Sister    Skin cancer Maternal Grandmother    Dementia Maternal Grandmother    Early death Maternal Grandfather    Lung cancer Maternal Grandfather    Breast cancer Paternal Grandmother    Eczema Paternal Grandfather    Lymphoma Paternal Grandfather      Social History: Shameria lives at home with her family.  She lives in a house that is 33 years old.  There is hardwood in the main living areas and carpeting in the bedroom.  She has gas heating and central cooling.  There is a dog inside of the home.  There are no dust mite covers in the bedding.  There is no tobacco exposure.  She currently works as a Barrister's clerk.  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  Her husband works from home in the business field.  They do not live near an interstate or industrial area.  She has a complicated relationship with her parents, who lives locally.  Review of systems otherwise negative other than that mentioned in the HPI.    Objective:   Blood pressure 122/78, pulse 97, temperature 98.9 F (37.2 C), temperature source Temporal, resp. rate 18, height 5\' 5"  (1.651 m), weight 162 lb 3.2 oz (73.6 kg), SpO2 100%, currently breastfeeding. Body mass index is 26.99 kg/m.     Physical Exam Vitals reviewed.  Constitutional:      Appearance: She is well-developed.     Comments: Very pleasant.  HENT:     Head: Normocephalic and atraumatic.     Right Ear: Tympanic membrane, ear canal and external ear normal. No drainage, swelling or  tenderness. Tympanic membrane is not injected, scarred, erythematous, retracted or bulging.     Left Ear: Tympanic membrane, ear canal and external ear normal. No drainage, swelling or tenderness. Tympanic membrane is not injected, scarred, erythematous, retracted or bulging.     Nose: No nasal deformity, septal deviation, mucosal edema or rhinorrhea.     Right Turbinates: Enlarged, swollen and pale.     Left Turbinates: Enlarged, swollen and pale.     Right Sinus: No maxillary sinus tenderness or frontal sinus tenderness.     Left Sinus: No maxillary sinus tenderness or frontal sinus tenderness.     Mouth/Throat:     Mouth: Mucous membranes are not pale and not dry.     Pharynx: Uvula midline.  Eyes:     General:        Right eye: No discharge.        Left eye: No discharge.     Conjunctiva/sclera: Conjunctivae normal.     Right eye: Right conjunctiva is not injected. No chemosis.    Left eye: Left conjunctiva is not injected. No chemosis.    Pupils: Pupils are equal, round, and reactive to light.  Cardiovascular:     Rate and Rhythm: Normal rate and regular rhythm.     Heart sounds: Normal heart sounds.  Pulmonary:     Effort: Pulmonary effort is normal. No tachypnea, accessory muscle usage or respiratory distress.     Breath sounds: Normal breath sounds. No wheezing, rhonchi or rales.  Chest:     Chest wall: No tenderness.  Abdominal:     Tenderness: There is no abdominal tenderness. There is no guarding or rebound.  Lymphadenopathy:     Head:     Right side of head: No submandibular, tonsillar or occipital adenopathy.     Left side of head: No submandibular, tonsillar or occipital adenopathy.     Cervical: No cervical adenopathy.  Skin:    General: Skin is warm.     Capillary Refill: Capillary refill takes less than 2 seconds.     Coloration: Skin is not pale.     Findings: No abrasion, erythema, petechiae or rash. Rash is not papular, urticarial or vesicular.     Comments:  Multiple tattoos.  Skin looks clear today.  Neurological:     Mental Status: She is alert.  Psychiatric:        Behavior: Behavior is cooperative.      Diagnostic studies: none (scheduling for NAC 80)          Drexel Gentles, MD [facility] 

## 2024-05-25 NOTE — Patient Instructions (Addendum)
 1. Rash - with confirmed allergic contact dermatitis - We may consider doing a more extensive patch testing done. - We have recently started doing a panel containing 80 different items on it. - We will get you scheduled for that.  - We are going to get some blood work to rule out weird causes of itching/hives.   2. Return in about 2 weeks (around 06/08/2024). You can have the follow up appointment with Dr. Idolina Maker or a Nurse Practicioner (our Nurse Practitioners are excellent and always have Physician oversight!).    Please inform us  of any Emergency Department visits, hospitalizations, or changes in symptoms. Call us  before going to the ED for breathing or allergy symptoms since we might be able to fit you in for a sick visit. Feel free to contact us  anytime with any questions, problems, or concerns.  It was a pleasure to meet you today!  Websites that have reliable patient information: 1. American Academy of Asthma, Allergy, and Immunology: www.aaaai.org 2. Food Allergy Research and Education (FARE): foodallergy.org 3. Mothers of Asthmatics: http://www.asthmacommunitynetwork.org 4. American College of Allergy, Asthma, and Immunology: www.acaai.org      "Like" us  on Facebook and Instagram for our latest updates!      A healthy democracy works best when [employer] participate! Make sure you are registered to vote! If you have moved or changed any of your contact information, you will need to get this updated before voting! Scan the QR codes below to learn more!

## 2024-05-27 LAB — ALPHA-GAL PANEL
Allergen Lamb IgE: 0.35 kU/L — AB
Beef IgE: <0.1 kU/L
IgE (Immunoglobulin E), Serum: 21 [IU]/mL (ref 6–495)
O215-IgE Alpha-Gal: <0.1 kU/L
Pork IgE: 0.55 kU/L — AB

## 2024-05-31 ENCOUNTER — Encounter: Payer: Self-pay | Admitting: Allergy & Immunology

## 2024-05-31 LAB — CMP14+EGFR
ALT: 12 IU/L (ref 0–32)
AST: 17 IU/L (ref 0–40)
Albumin: 4.6 g/dL (ref 3.9–4.9)
Alkaline Phosphatase: 67 IU/L (ref 44–121)
BUN/Creatinine Ratio: 14 (ref 9–23)
BUN: 11 mg/dL (ref 6–20)
Bilirubin Total: 0.3 mg/dL (ref 0.0–1.2)
CO2: 20 mmol/L (ref 20–29)
Calcium: 9.3 mg/dL (ref 8.7–10.2)
Chloride: 106 mmol/L (ref 96–106)
Creatinine, Ser: 0.8 mg/dL (ref 0.57–1.00)
Globulin, Total: 2.7 g/dL (ref 1.5–4.5)
Glucose: 99 mg/dL (ref 70–99)
Potassium: 4.1 mmol/L (ref 3.5–5.2)
Sodium: 142 mmol/L (ref 134–144)
Total Protein: 7.3 g/dL (ref 6.0–8.5)
eGFR: 100 mL/min/{1.73_m2} (ref >59)

## 2024-05-31 LAB — FANA STAINING PATTERNS: Speckled Pattern: 1:160 {titer} — ABNORMAL HIGH

## 2024-05-31 LAB — C-REACTIVE PROTEIN: CRP: <1 mg/L (ref 0–10)

## 2024-05-31 LAB — TRYPTASE: Tryptase: 8.2 ug/L (ref 2.2–13.2)

## 2024-05-31 LAB — SEDIMENTATION RATE: Sed Rate: 2 mm/h (ref 0–32)

## 2024-05-31 LAB — ANTINUCLEAR ANTIBODIES, IFA: ANA Titer 1: POSITIVE — AB

## 2024-06-01 ENCOUNTER — Encounter: Payer: Self-pay | Admitting: Family Medicine

## 2024-06-02 NOTE — Telephone Encounter (Signed)
 I consulted with Dr. Idolina Maker about the lab results so far. He said that she should still come in for patch testing as scheduled. Dr. Idolina Maker will reach out to her when all the labs have resulted. Thank you

## 2024-06-03 ENCOUNTER — Ambulatory Visit: Payer: Self-pay | Admitting: Allergy & Immunology

## 2024-06-13 NOTE — Progress Notes (Unsigned)
 Follow-up Note  RE: Subrina Vecchiarelli MRN: [id number] DOB: 02-09-1991 Date of Office Visit: 06/14/2024  Primary care provider: Allwardt, Mardy HERO, PA-C Referring provider: Allwardt, Mardy HERO, PA-C   Lashaundra returns to the office today for the patch test placement, given suspected history of contact dermatitis.   Patch testing in November 2024 suggested a possible allergy to gold as imidazolidinyl urea.   Diagnostics: NAC 80 patches placed NAC-80 (1-80)   1. Ammonium persulfate  2. [place] Balsam  3. Omitted  4. 4-tert-Butylphenolformaldehyde resin (PTBP)  5. Bacitracin  6. Budesonide  7. Quaternium-15  8. Cinnamal  9. Cobalt(II) chloride hexahydrate  10. Colophonium  11. Methyldibromo glutaronitrile  12. Decyl Glucoside  13. Ethylenediamine dihydrochloride  14. 2-Hydroxyethyl methacrylate  15. Hydroperoxides of Linalool  16. Iodopropynyl butylcarbamate  17. 2-Mercaptobenzothiazole (MBT)  18. Thiuram mix  19. METHYLISOTHIAZOLINONE  20. Propylene glycol  21. 1,3-Diphenylguanidine  22. Hydroperoxides of Limonene  23. Black rubber mix  24. Carba mix  25. Fragrance mix I  26. Fragrance mix II  27. Textile dye mix II  28. Neomycin sulfate  29. Nickel(II) sulfate hexahydrate  30. p-Phenylenediamine (PPD)  31. Potassium dichromate  32. Propolis  33. Sodium Metabisulfite  34. Tixocortol-21-pivalate  35. Lanolin alcohol  36. Methylisothiazolinone + Methylchloroisothiazolinone  37. Cocamidopropyl betaine  38. 3-(Dimethylamino)-1-propylamine  39. Formaldehyde  40. Oleamidopropyl dimethylamine  41. 2-Bromo-2-Nitropropane-l,3-diol  42. Diazolidinyl urea  43. DMDM Hydantoin  44. Epoxy resin, Bisphenol A  45. Benzophenone-4  46. Imidazolidinyl urea  47. Lauryl polyglucose  48 Methyl methacrylate  49. Paraben mix  50. Mercapto mix  51. Caine mix III  52. Mixed dialkyl thiourea  53. Compositae mix II  54. Toluenesulfonamide formaldehyde resin  55. Tea Tree Oil  oxidized  56. Ylang-Ylang oil  57. Amidoamine  58. Amerchol L 101  59. Benzocaine   60. Benzyl alchohol  61. Benzyl salicylate  62. Chloroxylenol (PCMX)  63. Cocamide DEA  64. Clobetasol-17-propionate  65. Toluene-2,5-Diamine sulfate  66. Ethyl acrylate  67. N-Isopropyl-N-phenyl--4-phenylenediamine (IPPD)  68. Lidocaine   69. omitted  70. Sesquiterpene lactone mix  71. 2-n-Octyl-4-isothiazolin-3-one  72. Propyl gallate  73. Polymyxin B sulfate  74. Pramoxine hydrochloride  75. Sodium benzoate  76. Sorbitan oleate  77. Sorbitan sesquioleate  78. Tocopherol  79. BENZALKONIUM CHLORIDE  80. Chlorhexidine  digluconate    Plan:   Allergic contact dermatitis - Instructions provided on care of the patches for the next 48 hours. Luria Rosario was instructed to avoid showering for the next 48 hours. Venissa Nappi will follow up in 48 hours and 96 hours for patch readings.

## 2024-06-14 ENCOUNTER — Ambulatory Visit: Admitting: Family Medicine

## 2024-06-14 ENCOUNTER — Encounter: Payer: Self-pay | Admitting: Family Medicine

## 2024-06-14 DIAGNOSIS — L235 Allergic contact dermatitis due to other chemical products: Secondary | ICD-10-CM

## 2024-06-14 DIAGNOSIS — L259 Unspecified contact dermatitis, unspecified cause: Secondary | ICD-10-CM | POA: Insufficient documentation

## 2024-06-14 NOTE — Patient Instructions (Signed)
 Diagnostics: NAC 80 patches placed NAC-80 (1-80)   1. Ammonium persulfate  2. [place] Balsam  3. Omitted  4. 4-tert-Butylphenolformaldehyde resin (PTBP)  5. Bacitracin  6. Budesonide  7. Quaternium-15  8. Cinnamal  9. Cobalt(II) chloride hexahydrate  10. Colophonium  11. Methyldibromo glutaronitrile  12. Decyl Glucoside  13. Ethylenediamine dihydrochloride  14. 2-Hydroxyethyl methacrylate  15. Hydroperoxides of Linalool  16. Iodopropynyl butylcarbamate  17. 2-Mercaptobenzothiazole (MBT)  18. Thiuram mix  19. METHYLISOTHIAZOLINONE  20. Propylene glycol  21. 1,3-Diphenylguanidine  22. Hydroperoxides of Limonene  23. Black rubber mix  24. Carba mix  25. Fragrance mix I  26. Fragrance mix II  27. Textile dye mix II  28. Neomycin sulfate  29. Nickel(II) sulfate hexahydrate  30. p-Phenylenediamine (PPD)  31. Potassium dichromate  32. Propolis  33. Sodium Metabisulfite  34. Tixocortol-21-pivalate  35. Lanolin alcohol  36. Methylisothiazolinone + Methylchloroisothiazolinone  37. Cocamidopropyl betaine  38. 3-(Dimethylamino)-1-propylamine  39. Formaldehyde  40. Oleamidopropyl dimethylamine  41. 2-Bromo-2-Nitropropane-l,3-diol  42. Diazolidinyl urea  43. DMDM Hydantoin  44. Epoxy resin, Bisphenol A  45. Benzophenone-4  46. Imidazolidinyl urea  47. Lauryl polyglucose  48 Methyl methacrylate  49. Paraben mix  50. Mercapto mix  51. Caine mix III  52. Mixed dialkyl thiourea  53. Compositae mix II  54. Toluenesulfonamide formaldehyde resin  55. Tea Tree Oil oxidized  56. Ylang-Ylang oil  57. Amidoamine  58. Amerchol L 101  59. Benzocaine   60. Benzyl alchohol  61. Benzyl salicylate  62. Chloroxylenol (PCMX)  63. Cocamide DEA  64. Clobetasol-17-propionate  65. Toluene-2,5-Diamine sulfate  66. Ethyl acrylate  67. N-Isopropyl-N-phenyl--4-phenylenediamine (IPPD)  68. Lidocaine   69. omitted  70. Sesquiterpene lactone mix  71. 2-n-Octyl-4-isothiazolin-3-one  72. Propyl  gallate  73. Polymyxin B sulfate  74. Pramoxine hydrochloride  75. Sodium benzoate  76. Sorbitan oleate  77. Sorbitan sesquioleate  78. Tocopherol  79. BENZALKONIUM CHLORIDE  80. Chlorhexidine  digluconate    Plan:   Allergic contact dermatitis - Instructions provided on care of the patches for the next 48 hours. Linda Stevenson was instructed to avoid showering for the next 48 hours. Linda Stevenson will follow up in 48 hours and 96 hours for patch readings.    Call the clinic if this treatment plan is not working well for you  Follow up in 2 days or sooner if needed.

## 2024-06-15 NOTE — Progress Notes (Unsigned)
 Follow Up Note  RE: Linda Stevenson MRN: [id number] DOB: 1991-03-15 Date of Office Visit: 06/16/2024  Referring provider: Allwardt, Mardy HERO, PA-C Primary care provider: Allwardt, Mardy HERO, PA-C  History of Present Illness: I had the pleasure of seeing Linda Stevenson for a follow up visit at the [facility] on 06/16/2024. She is a 33 y.o. female, who is being followed for dermatitis. Today she is here for initial patch test interpretation, given suspected history of contact dermatitis.   Diagnostics:  NAC-80 Panel 48 hour reading:  All negative.   NAC-80 - 06/16/24 1148     NAC Reading Interval Day 5    NAC Panel Tested NAC-80 (1-80)    1. Ammonium persulfate Negative    2. [place] Balsam Negative    3. BENZISOTHIAZOLINONE Comment   omit   4. 4-tert-Butylphenolformaldehyde resin (PTBP) Negative    5. Bacitracin Negative    6. Budesonide Negative    7. Quaternium-15 Negative    8. Cinnamal Negative    9. Cobalt(II) chloride hexahydrate Negative    10. Colophonium Negative    11. Methyldibromo glutaronitrile Negative    12. Decyl Glucoside Negative    13. Ethylenediamine dihydrochloride Negative    14. 2-Hydroxyethyl methacrylate Negative    15. Hydroperoxides of Linalool Negative    16. Iodopropynyl butylcarbamate Negative    17. 2-Mercaptobenzothiazole (MBT) Negative    18. Thiuram mix Negative    19. METHYLISOTHIAZOLINONE Negative    20. Propylene glycol Negative    21. 1,3-Diphenylguanidine Negative    22. Hydroperoxides of Limonene Negative    23. Black rubber mix Negative    24. Carba mix Negative    25. Fragrance mix I Negative    26. Fragrance mix II Negative    27. Textile dye mix II Negative    28. Neomycin sulfate Negative    29. Nickel(II) sulfate hexahydrate Negative    30. p-Phenylenediamine (PPD) Negative    31. Potassium dichromate Negative    32. Propolis Negative    33. Sodium Metabisulfite Negative    34.  Tixocortol-21-pivalate Negative    35. Lanolin alcohol Negative    36. Methylisothiazolinone + Methylchloroisothiazolinone Negative    37. Cocamidopropyl betaine Negative    38. 3-(Dimethylamino)-1-propylamine Negative    39. Formaldehyde Negative    40. Oleamidopropyl dimethylamine Negative    41. 2-Bromo-2-Nitropropane-l,3-diol Negative    42. Diazolidinyl urea Negative    43. DMDM Hydantoin Negative    44. Epoxy resin, Bisphenol A Negative    45. Benzophenone-4 Negative    46. Imidazolidinyl urea Negative    47. Lauryl polyglucose Negative    48 Methyl methacrylate Negative    49. Paraben mix Negative    50. Mercapto mix Negative    51. Caine mix III Negative    52. Mixed dialkyl thiourea Negative    53. Compositae mix II Negative    54. Toluenesulfonamide formaldehyde resin Negative    55. Tea Tree Oil oxidized Negative    56. Ylang-Ylang oil Negative    57. Amidoamine Negative    58. Amerchol L 101 Negative    59. Benzocaine  Negative    60. Benzyl alchohol Negative    61. Benzyl salicylate Negative    62. Chloroxylenol (PCMX) Negative    63. Cocamide DEA Negative    64. Clobetasol-17-propionate Negative    65. Toluene-2,5-Diamine sulfate Negative    66. Ethyl acrylate Negative    67. N-Isopropyl-N-phenyl--4-phenylenediamine (IPPD) Negative  68. Lidocaine  Negative    69. Hydroxyisohexyl 3-Cyclohexene Carboxaldehyde Comment   omit   70. Sesquiterpene lactone mix Negative    71. 2-n-Octyl-4-isothiazolin-3-one Negative    72. Propyl gallate Negative    73. Polymyxin B sulfate Negative    74. Pramoxine hydrochloride Negative    75. Sodium benzoate Negative    76. Sorbitan oleate Negative    77. Sorbitan sesquioleate Negative    78. Tocopherol Negative    79. BENZALKONIUM CHLORIDE Negative    80. Chlorhexidine  digluconate Negative           Assessment and Plan: Linda Stevenson is a 33 y.o. female with: Allergic contact dermatitis due to other agents Patches removed and  48 hour reading was all negative.  Follow up in 2 days for patch reading.   It was my pleasure to see Linda Stevenson today and participate in her care. Please feel free to contact me with any questions or concerns.  Sincerely,  Orlan Cramp, DO Allergy & Immunology  [facility]  [facility] office: [phone] [facility] office: [phone]

## 2024-06-16 ENCOUNTER — Ambulatory Visit (INDEPENDENT_AMBULATORY_CARE_PROVIDER_SITE_OTHER): Admitting: Allergy

## 2024-06-16 ENCOUNTER — Encounter: Payer: Self-pay | Admitting: Allergy

## 2024-06-16 DIAGNOSIS — L2389 Allergic contact dermatitis due to other agents: Secondary | ICD-10-CM

## 2024-06-17 NOTE — Progress Notes (Signed)
 Follow-up Note  RE: Linda Stevenson MRN: [id number] DOB: [birth year] Date of Office Visit: 06/18/2024  Primary care provider: Allwardt, Mardy HERO, PA-C Referring provider: Allwardt, Mardy HERO, PA-C   [name] returns to the office today for the final patch test interpretation, given suspected history of contact dermatitis. She reports that she did notice a rash that occurred after wearing some jewelry that she doesn't normally wear. She reports that the high dose antihistamine ladder helps to decrease itch from the rash, however, she recently took hydroxyzine with a significant decrease in itch.    Diagnostics:   NAC 80 96-hour reading:   NAC-80 - 06/17/2024    NAC Reading Interval Day 5    NAC Panel Tested NAC-80 (1-80)    1. Ammonium persulfate Negative    2. [place] Balsam Negative    3. BENZISOTHIAZOLINONE Comment   omit   4. 4-tert-Butylphenolformaldehyde resin (PTBP) Negative    5. Bacitracin Negative    6. Budesonide Negative    7. Quaternium-15 Negative    8. Cinnamal Negative    9. Cobalt(II) chloride hexahydrate 2+   10. Colophonium Negative    11. Methyldibromo glutaronitrile Negative    12. Decyl Glucoside Negative    13. Ethylenediamine dihydrochloride Negative    14. 2-Hydroxyethyl methacrylate Negative    15. Hydroperoxides of Linalool Negative    16. Iodopropynyl butylcarbamate Negative    17. 2-Mercaptobenzothiazole (MBT) Negative    18. Thiuram mix Negative    19. METHYLISOTHIAZOLINONE Negative    20. Propylene glycol Negative    21. 1,3-Diphenylguanidine Negative    22. Hydroperoxides of Limonene Negative    23. Black rubber mix Negative    24. Carba mix Negative    25. Fragrance mix I Negative    26. Fragrance mix II Negative    27. Textile dye mix II Negative    28. Neomycin sulfate Negative    29. Nickel(II) sulfate hexahydrate 2+   30. p-Phenylenediamine (PPD) Negative    31. Potassium dichromate Negative    32. Propolis Negative    33.  Sodium Metabisulfite Negative    34. Tixocortol-21-pivalate Negative    35. Lanolin alcohol Negative    36. Methylisothiazolinone + Methylchloroisothiazolinone Negative    37. Cocamidopropyl betaine Negative    38. 3-(Dimethylamino)-1-propylamine Negative    39. Formaldehyde Negative    40. Oleamidopropyl dimethylamine Negative    41. 2-Bromo-2-Nitropropane-l,3-diol Negative    42. Diazolidinyl urea Negative    43. DMDM Hydantoin Negative    44. Epoxy resin, Bisphenol A Negative    45. Benzophenone-4 Negative    46. Imidazolidinyl urea Negative    47. Lauryl polyglucose Negative    48 Methyl methacrylate Negative    49. Paraben mix Negative    50. Mercapto mix Negative    51. Caine mix III Negative    52. Mixed dialkyl thiourea Negative    53. Compositae mix II Negative    54. Toluenesulfonamide formaldehyde resin Negative    55. Tea Tree Oil oxidized Negative    56. Ylang-Ylang oil Negative    57. Amidoamine Negative    58. Amerchol L 101 Negative    59. Benzocaine  Negative    60. Benzyl alchohol Negative    61. Benzyl salicylate Negative    62. Chloroxylenol (PCMX) Negative    63. Cocamide DEA Negative    64. Clobetasol-17-propionate Negative    65. Toluene-2,5-Diamine sulfate Negative    66. Ethyl acrylate Negative    67. N-Isopropyl-N-phenyl--4-phenylenediamine (IPPD)  Negative    68. Lidocaine  Negative    69. Hydroxyisohexyl 3-Cyclohexene Carboxaldehyde Comment   omit   70. Sesquiterpene lactone mix Negative    71. 2-n-Octyl-4-isothiazolin-3-one Negative    72. Propyl gallate Negative    73. Polymyxin B sulfate Negative    74. Pramoxine hydrochloride Negative    75. Sodium benzoate Negative    76. Sorbitan oleate Negative    77. Sorbitan sesquioleate Negative    78. Tocopherol Negative    79. BENZALKONIUM CHLORIDE Negative    80. Chlorhexidine  digluconate Negative           Plan:   Allergic contact dermatitis - The patient has been provided detailed  information regarding the substances she is sensitive to, as well as products containing the substances.   - Meticulous avoidance of these substances is recommended.  - If avoidance is not possible, the use of barrier creams or lotions is recommended. - If symptoms persist or progress despite meticulous avoidance of the substances listed above, a dermatology referral may be warranted. - The sensitivity of patch testing can range from 60-80% and therefore false negative results are possible. Therefore, this does not definitively rule out contact dermatitis.  - Continue high dose antihistamines as needed for rash - You may use hydroxyzine for breakthrough itch and rash if not controlled with second generation antihistamines  Call the clinic if this treatment plan is not working well for you  Follow up in 3 months or sooner if needed.   Thank you for the opportunity to care for this patient.  Please do not hesitate to contact me with questions.  Arlean Mutter, FNP [facility]  [facility]

## 2024-06-18 ENCOUNTER — Encounter: Payer: Self-pay | Admitting: Family Medicine

## 2024-06-18 ENCOUNTER — Ambulatory Visit (INDEPENDENT_AMBULATORY_CARE_PROVIDER_SITE_OTHER): Admitting: Family Medicine

## 2024-06-18 DIAGNOSIS — L2389 Allergic contact dermatitis due to other agents: Secondary | ICD-10-CM | POA: Diagnosis not present

## 2024-06-18 MED ORDER — HYDROXYZINE HCL 10 MG PO TABS: ORAL_TABLET | ORAL | 0 refills | Status: AC

## 2024-06-18 NOTE — Patient Instructions (Addendum)
 Diagnostics:   NAC 80 96-hour reading:   NAC-80 - 06/17/2024    NAC Reading Interval Day 5    NAC Panel Tested NAC-80 (1-80)    1. Ammonium persulfate Negative    2. [place] Balsam Negative    3. BENZISOTHIAZOLINONE Comment   omit   4. 4-tert-Butylphenolformaldehyde resin (PTBP) Negative    5. Bacitracin Negative    6. Budesonide Negative    7. Quaternium-15 Negative    8. Cinnamal Negative    9. Cobalt(II) chloride hexahydrate 2+   10. Colophonium Negative    11. Methyldibromo glutaronitrile Negative    12. Decyl Glucoside Negative    13. Ethylenediamine dihydrochloride Negative    14. 2-Hydroxyethyl methacrylate Negative    15. Hydroperoxides of Linalool Negative    16. Iodopropynyl butylcarbamate Negative    17. 2-Mercaptobenzothiazole (MBT) Negative    18. Thiuram mix Negative    19. METHYLISOTHIAZOLINONE Negative    20. Propylene glycol Negative    21. 1,3-Diphenylguanidine Negative    22. Hydroperoxides of Limonene Negative    23. Black rubber mix Negative    24. Carba mix Negative    25. Fragrance mix I Negative    26. Fragrance mix II Negative    27. Textile dye mix II Negative    28. Neomycin sulfate Negative    29. Nickel(II) sulfate hexahydrate 2+   30. p-Phenylenediamine (PPD) Negative    31. Potassium dichromate Negative    32. Propolis Negative    33. Sodium Metabisulfite Negative    34. Tixocortol-21-pivalate Negative    35. Lanolin alcohol Negative    36. Methylisothiazolinone + Methylchloroisothiazolinone Negative    37. Cocamidopropyl betaine Negative    38. 3-(Dimethylamino)-1-propylamine Negative    39. Formaldehyde Negative    40. Oleamidopropyl dimethylamine Negative    41. 2-Bromo-2-Nitropropane-l,3-diol Negative    42. Diazolidinyl urea Negative    43. DMDM Hydantoin Negative    44. Epoxy resin, Bisphenol A Negative    45. Benzophenone-4 Negative    46. Imidazolidinyl urea Negative    47. Lauryl polyglucose Negative    48 Methyl methacrylate  Negative    49. Paraben mix Negative    50. Mercapto mix Negative    51. Caine mix III Negative    52. Mixed dialkyl thiourea Negative    53. Compositae mix II Negative    54. Toluenesulfonamide formaldehyde resin Negative    55. Tea Tree Oil oxidized Negative    56. Ylang-Ylang oil Negative    57. Amidoamine Negative    58. Amerchol L 101 Negative    59. Benzocaine  Negative    60. Benzyl alchohol Negative    61. Benzyl salicylate Negative    62. Chloroxylenol (PCMX) Negative    63. Cocamide DEA Negative    64. Clobetasol-17-propionate Negative    65. Toluene-2,5-Diamine sulfate Negative    66. Ethyl acrylate Negative    67. N-Isopropyl-N-phenyl--4-phenylenediamine (IPPD) Negative    68. Lidocaine  Negative    69. Hydroxyisohexyl 3-Cyclohexene Carboxaldehyde Comment   omit   70. Sesquiterpene lactone mix Negative    71. 2-n-Octyl-4-isothiazolin-3-one Negative    72. Propyl gallate Negative    73. Polymyxin B sulfate Negative    74. Pramoxine hydrochloride Negative    75. Sodium benzoate Negative    76. Sorbitan oleate Negative    77. Sorbitan sesquioleate Negative    78. Tocopherol Negative    79. BENZALKONIUM CHLORIDE Negative    80. Chlorhexidine  digluconate Negative  Plan:   Allergic contact dermatitis - The patient has been provided detailed information regarding the substances she is sensitive to, as well as products containing the substances.   - Meticulous avoidance of these substances is recommended.  - If avoidance is not possible, the use of barrier creams or lotions is recommended. - If symptoms persist or progress despite meticulous avoidance of the substances listed above, a dermatology referral may be warranted. - The sensitivity of patch testing can range from 60-80% and therefore false negative results are possible. Therefore, this does not definitively rule out contact dermatitis.  - Continue high dose antihistamines as needed for rash - You  may use hydroxyzine for breakthrough itch and rash if not controlled with second generation antihistamines  Call the clinic if this treatment plan is not working well for you  Follow up in 3 months or sooner if needed.

## 2024-07-08 ENCOUNTER — Ambulatory Visit: Admitting: Allergy & Immunology

## 2025-01-03 ENCOUNTER — Ambulatory Visit: Payer: Self-pay | Admitting: Physician Assistant

## 2025-01-03 ENCOUNTER — Ambulatory Visit: Payer: 59 | Admitting: Physician Assistant

## 2025-01-03 VITALS — BP 110/68 | HR 76 | Temp 98.1°F | Ht 65.75 in | Wt 162.2 lb

## 2025-01-03 DIAGNOSIS — Z131 Encounter for screening for diabetes mellitus: Secondary | ICD-10-CM

## 2025-01-03 DIAGNOSIS — Z8261 Family history of arthritis: Secondary | ICD-10-CM

## 2025-01-03 DIAGNOSIS — Z1283 Encounter for screening for malignant neoplasm of skin: Secondary | ICD-10-CM

## 2025-01-03 DIAGNOSIS — Z13220 Encounter for screening for lipoid disorders: Secondary | ICD-10-CM

## 2025-01-03 DIAGNOSIS — Z0001 Encounter for general adult medical examination with abnormal findings: Secondary | ICD-10-CM | POA: Diagnosis not present

## 2025-01-03 DIAGNOSIS — R7689 Other specified abnormal immunological findings in serum: Secondary | ICD-10-CM

## 2025-01-03 DIAGNOSIS — Z808 Family history of malignant neoplasm of other organs or systems: Secondary | ICD-10-CM | POA: Diagnosis not present

## 2025-01-03 DIAGNOSIS — Z0000 Encounter for general adult medical examination without abnormal findings: Secondary | ICD-10-CM | POA: Diagnosis not present

## 2025-01-03 LAB — COMPREHENSIVE METABOLIC PANEL WITH GFR
ALT: 11 U/L (ref 3–35)
AST: 14 U/L (ref 5–37)
Albumin: 4.6 g/dL (ref 3.5–5.2)
Alkaline Phosphatase: 52 U/L (ref 39–117)
BUN: 13 mg/dL (ref 6–23)
CO2: 29 meq/L (ref 19–32)
Calcium: 9.1 mg/dL (ref 8.4–10.5)
Chloride: 105 meq/L (ref 96–112)
Creatinine, Ser: 0.82 mg/dL (ref 0.40–1.20)
GFR: 94.01 mL/min
Glucose, Bld: 86 mg/dL (ref 70–99)
Potassium: 4 meq/L (ref 3.5–5.1)
Sodium: 140 meq/L (ref 135–145)
Total Bilirubin: 0.5 mg/dL (ref 0.2–1.2)
Total Protein: 7.2 g/dL (ref 6.0–8.3)

## 2025-01-03 LAB — CBC WITH DIFFERENTIAL/PLATELET
Basophils Absolute: 0.1 K/uL (ref 0.0–0.1)
Basophils Relative: 1 % (ref 0.0–3.0)
Eosinophils Absolute: 0.2 K/uL (ref 0.0–0.7)
Eosinophils Relative: 2.6 % (ref 0.0–5.0)
HCT: 42.6 % (ref 36.0–46.0)
Hemoglobin: 14.5 g/dL (ref 12.0–15.0)
Lymphocytes Relative: 20 % (ref 12.0–46.0)
Lymphs Abs: 1.3 K/uL (ref 0.7–4.0)
MCHC: 34.1 g/dL (ref 30.0–36.0)
MCV: 91.8 fl (ref 78.0–100.0)
Monocytes Absolute: 0.3 K/uL (ref 0.1–1.0)
Monocytes Relative: 5.2 % (ref 3.0–12.0)
Neutro Abs: 4.6 K/uL (ref 1.4–7.7)
Neutrophils Relative %: 71.2 % (ref 43.0–77.0)
Platelets: 251 K/uL (ref 150.0–400.0)
RBC: 4.64 Mil/uL (ref 3.87–5.11)
RDW: 13.7 % (ref 11.5–15.5)
WBC: 6.5 K/uL (ref 4.0–10.5)

## 2025-01-03 LAB — VITAMIN B12: Vitamin B-12: 308 pg/mL (ref 211–911)

## 2025-01-03 LAB — VITAMIN D 25 HYDROXY (VIT D DEFICIENCY, FRACTURES): VITD: 25.38 ng/mL — ABNORMAL LOW (ref 30.00–100.00)

## 2025-01-03 LAB — HEMOGLOBIN A1C: Hgb A1c MFr Bld: 5 % (ref 4.6–6.5)

## 2025-01-03 LAB — LIPID PANEL
Cholesterol: 156 mg/dL (ref 28–200)
HDL: 51.3 mg/dL
LDL Cholesterol: 94 mg/dL (ref 10–99)
NonHDL: 104.87
Total CHOL/HDL Ratio: 3
Triglycerides: 54 mg/dL (ref 10.0–149.0)
VLDL: 10.8 mg/dL (ref 0.0–40.0)

## 2025-01-03 LAB — TSH: TSH: 2.63 u[IU]/mL (ref 0.35–5.50)

## 2025-01-03 NOTE — Progress Notes (Signed)
 "   Patient ID: Linda Stevenson, female    DOB: 04/03/1991, 34 y.o.   MRN: [id number]   Assessment & Plan:  Annual physical exam -     CBC with Differential/Platelet -     Comprehensive metabolic panel with GFR -     Hemoglobin A1c -     Lipid panel -     TSH -     VITAMIN D  25 Hydroxy (Vit-D Deficiency, Fractures) -     Vitamin B12  Positive ANA (antinuclear antibody) -     Ambulatory referral to Rheumatology  Family history of rheumatoid arthritis -     Ambulatory referral to Rheumatology  Skin cancer screening -     Ambulatory referral to Dermatology  Family history of melanoma -     Ambulatory referral to Dermatology      Assessment and Plan Assessment & Plan Positive ANA with family history of rheumatoid arthritis Positive ANA with a family history of severe rheumatoid arthritis in her father. Reports intermittent joint pain in hands, hips, and ankles, and experiences dry mouth despite adequate hydration. No alarming symptoms currently, but given family history, further evaluation is warranted to rule out potential autoimmune conditions. - Referred to rheumatology for further evaluation of positive ANA and family history of rheumatoid arthritis. - Monitor for symptoms such as rashes, joint stiffness, dry eyes, and dry mouth.  General Health Maintenance Annual physical exam conducted. Pap smear was normal last year. Regular menstrual cycles reported. Engages in regular physical activity and maintains a healthy diet. No current use of hormonal birth control; partner considering vasectomy. Family history of melanoma noted, with previous unsatisfactory dermatology consultation. - Ordered basic labs as part of the physical exam. - Referred to [facility] for regular full skin checks due to family history of melanoma. - Advised on self-breast exams and signs to watch for, such as hard lumps, skin changes, or dimpling. - Encouraged continuation of regular  exercise and healthy diet.   Age-appropriate screening and counseling performed today. Will check labs and call with results. Preventive measures discussed and printed in AVS for patient.   Patient Counseling: [x]   Nutrition: Stressed importance of moderation in sodium/caffeine intake, saturated fat and cholesterol, caloric balance, sufficient intake of fresh fruits, vegetables, and fiber.  [x]   Stressed the importance of regular exercise.   []   Substance Abuse: Discussed cessation/primary prevention of tobacco, alcohol, or other drug use; driving or other dangerous activities under the influence; availability of treatment for abuse.   []   Injury prevention: Discussed safety belts, safety helmets, smoke detector, smoking near bedding or upholstery.   []   Sexuality: Discussed sexually transmitted diseases, partner selection, use of condoms, avoidance of unintended pregnancy  and contraceptive alternatives.   [x]   Dental health: Discussed importance of regular tooth brushing, flossing, and dental visits.  [x]   Health maintenance and immunizations reviewed. Please refer to Health maintenance section.       Return in about 1 year (around 01/03/2026) for physical.    Subjective:    Chief Complaint  Patient presents with   Annual Exam    Pt in office for annual CPE and labs;     HPI Discussed the use of AI scribe software for clinical note transcription with the patient, who gave verbal consent to proceed.  History of Present Illness Linda Stevenson is a 34 year old female who presents for her annual physical exam.  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 Her children are now four and a half and six and a half years old. 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  She exercises three to four times a week, focusing on  strength training and walking, but is frustrated by the lack of weight loss. She has stopped breastfeeding after five years and is aware of the changes in her body in her thirties. She and her husband are eating well and have stopped drinking alcohol.  She reported that her allergist said her labs were not alarmingly positive and that she could follow up with rheumatology if desired, but she has not done so. She experiences pain in her hands, hips, and ankles, and has a dry mouth despite adequate hydration. She has a family history of autoimmune conditions.  She is concerned about a specific mole with a dark spot around it. She has a family history of melanoma on her mother's side and has experienced dissatisfaction with previous dermatology care.     Past Medical History:  Diagnosis Date   Anxiety 03/04/2019   Endometriosis    Pelvic pain in female     Past Surgical History:  Procedure Laterality Date   APPENDECTOMY     DIAGNOSTIC LAPAROSCOPY     endometriosis, scar tissue   LAPAROSCOPY N/A 04/02/2016   Procedure: LAPAROSCOPY DIAGNOSTIC with lysis of adhesions and cautery of endometriotic implants.;  Surgeon: Norleen Skill, MD;  Location: [facility];  Service: Gynecology;  Laterality: N/A;   WISDOM TOOTH EXTRACTION  07/24/2015    Family History  Problem Relation Age of Onset   Anxiety disorder Mother    Depression Mother    Lumbar disc disease Mother    Rheum arthritis Father    Arthritis Father    Depression Sister    Skin cancer Maternal Grandmother    Dementia Maternal Grandmother    Early death Maternal Grandfather    Lung cancer Maternal Grandfather    Breast cancer Paternal Grandmother    Eczema Paternal Grandfather    Lymphoma Paternal Grandfather     Social History[1]   Allergies[2]  Review of Systems NEGATIVE UNLESS OTHERWISE INDICATED IN HPI      Objective:     BP 110/68 (BP Location: Left Arm, Patient Position: Sitting, Cuff Size: Normal)   Pulse 76    Temp 98.1 F (36.7 C) (Temporal)   Ht 5' 5.75 (1.67 m)   Wt 162 lb 3.2 oz (73.6 kg)   LMP 12/21/2024 (Exact Date)   SpO2 100%   Breastfeeding No   BMI 26.38 kg/m   Wt Readings from Last 3 Encounters:  01/03/25 162 lb 3.2 oz (73.6 kg)  05/25/24 162 lb 3.2 oz (73.6 kg)  01/01/24 160 lb 12.8 oz (72.9 kg)    BP Readings from Last 3 Encounters:  01/03/25 110/68  05/25/24 122/78  01/01/24 109/71     Physical Exam Vitals and nursing note reviewed.  Constitutional:      Appearance: Normal appearance. She is normal weight. She is not toxic-appearing.  HENT:     Head: Normocephalic and atraumatic.     Right Ear: Tympanic membrane, ear canal and external ear normal.     Left Ear: Tympanic membrane, ear canal and external ear normal.     Nose: Nose normal.     Mouth/Throat:     Mouth: Mucous membranes are moist.  Eyes:  Extraocular Movements: Extraocular movements intact.     Conjunctiva/sclera: Conjunctivae normal.     Pupils: Pupils are equal, round, and reactive to light.  Cardiovascular:     Rate and Rhythm: Normal rate and regular rhythm.     Pulses: Normal pulses.     Heart sounds: Normal heart sounds.  Pulmonary:     Effort: Pulmonary effort is normal.     Breath sounds: Normal breath sounds.  Abdominal:     General: Abdomen is flat. Bowel sounds are normal.     Palpations: Abdomen is soft.  Musculoskeletal:        General: Normal range of motion.     Cervical back: Normal range of motion and neck supple.     Right lower leg: No edema.     Left lower leg: No edema.  Skin:    General: Skin is warm and dry.     Findings: Lesion (numerous benign nevi - see photo left upper arm for monitoring) present.  Neurological:     General: No focal deficit present.     Mental Status: She is alert and oriented to person, place, and time.  Psychiatric:        Mood and Affect: Mood normal.        Behavior: Behavior normal.        Thought Content: Thought content normal.         Judgment: Judgment normal.             Hye Trawick M Rally Ouch, PA-C    [1]  Social History Tobacco Use   Smoking status: Never   Smokeless tobacco: Never  Substance Use Topics   Alcohol use: Yes    Alcohol/week: 3.0 standard drinks of alcohol    Comment: occasional beer   Drug use: Never  [2]  Allergies Allergen Reactions   C[place] Dermatitis   Nickel Dermatitis   "

## 2025-01-03 NOTE — Patient Instructions (Signed)
 Keep up the amazing work!

## 2025-02-03 ENCOUNTER — Ambulatory Visit

## 2025-09-26 ENCOUNTER — Ambulatory Visit: Admitting: Physician Assistant

## 2026-01-04 ENCOUNTER — Encounter: Admitting: Physician Assistant
